# Patient Record
Sex: Male | Born: 1945
Health system: Southern US, Community
[De-identification: ages and names within clinical notes are randomized; demographics above are authoritative.]

## PROBLEM LIST (undated history)

## (undated) DIAGNOSIS — I10 Essential (primary) hypertension: Secondary | ICD-10-CM

## (undated) DIAGNOSIS — E119 Type 2 diabetes mellitus without complications: Secondary | ICD-10-CM

## (undated) DIAGNOSIS — E785 Hyperlipidemia, unspecified: Secondary | ICD-10-CM

## (undated) HISTORY — PX: HERNIA REPAIR: SHX51

---

## 2011-06-11 ENCOUNTER — Encounter (HOSPITAL_BASED_OUTPATIENT_CLINIC_OR_DEPARTMENT_OTHER): Payer: Medicare Other | Attending: General Surgery

## 2011-06-11 DIAGNOSIS — M21969 Unspecified acquired deformity of unspecified lower leg: Secondary | ICD-10-CM | POA: Insufficient documentation

## 2011-06-11 DIAGNOSIS — L97509 Non-pressure chronic ulcer of other part of unspecified foot with unspecified severity: Secondary | ICD-10-CM | POA: Insufficient documentation

## 2011-06-11 DIAGNOSIS — Z79899 Other long term (current) drug therapy: Secondary | ICD-10-CM | POA: Insufficient documentation

## 2011-06-11 DIAGNOSIS — Z7982 Long term (current) use of aspirin: Secondary | ICD-10-CM | POA: Insufficient documentation

## 2011-06-11 DIAGNOSIS — I10 Essential (primary) hypertension: Secondary | ICD-10-CM | POA: Insufficient documentation

## 2011-06-11 DIAGNOSIS — Z8546 Personal history of malignant neoplasm of prostate: Secondary | ICD-10-CM | POA: Insufficient documentation

## 2011-06-11 DIAGNOSIS — E1142 Type 2 diabetes mellitus with diabetic polyneuropathy: Secondary | ICD-10-CM | POA: Insufficient documentation

## 2011-06-11 DIAGNOSIS — E1149 Type 2 diabetes mellitus with other diabetic neurological complication: Secondary | ICD-10-CM | POA: Insufficient documentation

## 2011-06-18 ENCOUNTER — Encounter (HOSPITAL_BASED_OUTPATIENT_CLINIC_OR_DEPARTMENT_OTHER): Payer: Medicare Other | Attending: General Surgery

## 2011-06-18 ENCOUNTER — Other Ambulatory Visit (HOSPITAL_BASED_OUTPATIENT_CLINIC_OR_DEPARTMENT_OTHER): Payer: Self-pay | Admitting: General Surgery

## 2011-06-18 ENCOUNTER — Ambulatory Visit (HOSPITAL_COMMUNITY)
Admission: RE | Admit: 2011-06-18 | Discharge: 2011-06-18 | Disposition: A | Discharge status: Home / Self Care | Payer: Medicare Other | Source: Ambulatory Visit | Attending: General Surgery | Admitting: General Surgery

## 2011-06-18 DIAGNOSIS — L97509 Non-pressure chronic ulcer of other part of unspecified foot with unspecified severity: Secondary | ICD-10-CM | POA: Insufficient documentation

## 2011-06-18 DIAGNOSIS — L02619 Cutaneous abscess of unspecified foot: Secondary | ICD-10-CM | POA: Insufficient documentation

## 2011-06-18 DIAGNOSIS — Z7982 Long term (current) use of aspirin: Secondary | ICD-10-CM | POA: Insufficient documentation

## 2011-06-18 DIAGNOSIS — Z8546 Personal history of malignant neoplasm of prostate: Secondary | ICD-10-CM | POA: Insufficient documentation

## 2011-06-18 DIAGNOSIS — L03039 Cellulitis of unspecified toe: Secondary | ICD-10-CM | POA: Insufficient documentation

## 2011-06-18 DIAGNOSIS — M869 Osteomyelitis, unspecified: Secondary | ICD-10-CM

## 2011-06-18 DIAGNOSIS — Z79899 Other long term (current) drug therapy: Secondary | ICD-10-CM | POA: Insufficient documentation

## 2011-06-18 DIAGNOSIS — E1142 Type 2 diabetes mellitus with diabetic polyneuropathy: Secondary | ICD-10-CM | POA: Insufficient documentation

## 2011-06-18 DIAGNOSIS — E1149 Type 2 diabetes mellitus with other diabetic neurological complication: Secondary | ICD-10-CM | POA: Insufficient documentation

## 2011-06-18 DIAGNOSIS — E119 Type 2 diabetes mellitus without complications: Secondary | ICD-10-CM | POA: Insufficient documentation

## 2011-06-18 DIAGNOSIS — I10 Essential (primary) hypertension: Secondary | ICD-10-CM | POA: Insufficient documentation

## 2011-06-18 DIAGNOSIS — M21969 Unspecified acquired deformity of unspecified lower leg: Secondary | ICD-10-CM | POA: Insufficient documentation

## 2011-06-27 ENCOUNTER — Other Ambulatory Visit: Payer: Medicare Other | Admitting: *Deleted

## 2011-06-27 ENCOUNTER — Ambulatory Visit (INDEPENDENT_AMBULATORY_CARE_PROVIDER_SITE_OTHER): Payer: Medicare Other | Admitting: *Deleted

## 2011-06-27 DIAGNOSIS — L97509 Non-pressure chronic ulcer of other part of unspecified foot with unspecified severity: Secondary | ICD-10-CM

## 2011-07-08 ENCOUNTER — Other Ambulatory Visit: Payer: Self-pay

## 2011-07-09 NOTE — H&P (Signed)
  NAMEKACY, CONELY NO.:  [id number]  MEDICAL RECORD NO.:  [id number]  LOCATION:  FOOT                         FACILITY:  [facility]  PHYSICIAN:  Joanne Gavel, M.D.        DATE OF BIRTH:  April 29, 1946  DATE OF ADMISSION:  06/11/2011 DATE OF DISCHARGE:                             HISTORY & PHYSICAL   CHIEF COMPLAINT:  Wound plantar surface, left great toe.  HISTORY OF PRESENT ILLNESS:  This is a longstanding diabetic patient with neuropathy with an ulceration on the plantar surface of his left great toe present approximately 5 months.  This has been treated locally by the patient.  There has been no pain.  No discharge.  No fever, chills, or sweating spells.  PAST MEDICAL HISTORY:  Significant for diabetes, hypertension, low-grade prostate carcinoma, and left ulnar neuropathy.  PAST SURGICAL HISTORY:  Bilateral carpal tunnel syndrome surgery and right inguinal hernia repair.  SOCIAL HISTORY:  Cigarettes:  None for 30 years.  Alcohol:  None.  MEDICATIONS:  Aspirin, Viagra, Zocor, lisinopril, Januvia, Lantus, and metformin.  ALLERGIES:  None.  REVIEW OF SYSTEMS:  Otherwise essentially negative.  PHYSICAL EXAMINATION:  GENERAL:  Well-developed, well-nourished in no distress. VITAL SIGNS:  Temperature 98.5, pulse 64, respirations 18, blood pressure 169/84. EYES, EARS, NOSE, THROAT:  Normal. NECK:  Supple. CHEST:  Clear.  Normal symmetrical strength. ABDOMEN:  Not examined. EXTREMITIES:  Examination of the extremities reveals peripheral pulses are palpable.  Skin nutrition is generally normal.  The left great toe has a 0.8 x 0.9 ulceration with surrounding callus.  On the right foot, there is deformity with superior facing abnormality and an area of skin discoloration which is right over a palpable bone.  PLAN OF TREATMENT:  Start silver collagen, Darco boot for offloading, consult Orthopedics about the right foot deformity, get vascular studies.  We will  see the patient in 7 days.     Joanne Gavel, M.D.     RA/MEDQ  D:  06/11/2011  T:  06/11/2011  Job:  [id number]  cc:   Thora Lance, M.D.  Electronically Signed by Joanne Gavel M.D. on 07/09/2011 09:17:57 AM

## 2014-05-20 ENCOUNTER — Other Ambulatory Visit: Payer: Self-pay | Admitting: Urology

## 2014-06-03 ENCOUNTER — Encounter (HOSPITAL_COMMUNITY): Payer: Self-pay | Admitting: Pharmacy Technician

## 2014-06-10 ENCOUNTER — Other Ambulatory Visit (HOSPITAL_COMMUNITY): Payer: Self-pay | Admitting: Anesthesiology

## 2014-06-10 NOTE — Patient Instructions (Signed)
[place]  06/10/2014   Your procedure is scheduled on: Friday October 2nd, 2015  Report to [facility] Main Entrance and follow signs to  [place] at  530 AM.  Call this number if you have problems the morning of surgery [phone]   Remember:  Do not eat food or drink liquids :After Midnight.     Take these medicines the morning of surgery with A SIP OF WATER:                                You may not have any metal on your body including hair pins and piercings  Do not wear jewelry, make-up, lotions, powders, or deodorant.   Men may shave face and neck.  Do not bring valuables to the hospital. [place].  Contacts, dentures or bridgework may not be worn into surgery.  Leave suitcase in the car. After surgery it may be brought to your room.  For patients admitted to the hospital, checkout time is 11:00 AM the day of discharge.   Patients discharged the day of surgery will not be allowed to drive home.  Name and phone number of your driver:  Special Instructions: N/A ________________________________________________________________________  [facility] - Preparing for Surgery Before surgery, you can play an important role.  Because skin is not sterile, your skin needs to be as free of germs as possible.  You can reduce the number of germs on your skin by washing with CHG (chlorahexidine gluconate) soap before surgery.  CHG is an antiseptic cleaner which kills germs and bonds with the skin to continue killing germs even after washing. Please DO NOT use if you have an allergy to CHG or antibacterial soaps.  If your skin becomes reddened/irritated stop using the CHG and inform your nurse when you arrive at Short Stay. Do not shave (including legs and underarms) for at least 48 hours prior to the first CHG shower.  You may shave your face/neck. Please follow these instructions carefully:  1.  Shower with CHG Soap the night before  surgery and the  morning of Surgery.  2.  If you choose to wash your hair, wash your hair first as usual with your  normal  shampoo.  3.  After you shampoo, rinse your hair and body thoroughly to remove the  shampoo.                           4.  Use CHG as you would any other liquid soap.  You can apply chg directly  to the skin and wash                       Gently with a scrungie or clean washcloth.  5.  Apply the CHG Soap to your body ONLY FROM THE NECK DOWN.   Do not use on face/ open                           Wound or open sores. Avoid contact with eyes, ears mouth and genitals (private parts).                       Wash face,  Genitals (private parts) with your normal soap.  6.  Wash thoroughly, paying special attention to the area where your surgery  will be performed.  7.  Thoroughly rinse your body with warm water from the neck down.  8.  DO NOT shower/wash with your normal soap after using and rinsing off  the CHG Soap.                9.  Pat yourself dry with a clean towel.            10.  Wear clean pajamas.            11.  Place clean sheets on your bed the night of your first shower and do not  sleep with pets. Day of Surgery : Do not apply any lotions/deodorants the morning of surgery.  Please wear clean clothes to the hospital/surgery center.  FAILURE TO FOLLOW THESE INSTRUCTIONS MAY RESULT IN THE CANCELLATION OF YOUR SURGERY PATIENT SIGNATURE_________________________________  NURSE SIGNATURE__________________________________  ________________________________________________________________________

## 2014-06-13 ENCOUNTER — Encounter (HOSPITAL_COMMUNITY): Payer: Self-pay | Admitting: Pharmacy Technician

## 2014-06-13 ENCOUNTER — Inpatient Hospital Stay (HOSPITAL_COMMUNITY)
Admission: RE | Admit: 2014-06-13 | Discharge: 2014-06-13 | Disposition: A | Discharge status: Home / Self Care | Payer: Medicare HMO | Source: Ambulatory Visit

## 2014-06-14 ENCOUNTER — Encounter (HOSPITAL_COMMUNITY)
Admission: RE | Admit: 2014-06-14 | Discharge: 2014-06-14 | Disposition: A | Discharge status: Home / Self Care | Payer: Medicare HMO | Source: Ambulatory Visit | Attending: Urology | Admitting: Urology

## 2014-06-14 ENCOUNTER — Encounter (HOSPITAL_COMMUNITY): Payer: Self-pay

## 2014-06-14 ENCOUNTER — Ambulatory Visit (HOSPITAL_COMMUNITY)
Admission: RE | Admit: 2014-06-14 | Discharge: 2014-06-14 | Disposition: A | Discharge status: Home / Self Care | Payer: Medicare HMO | Source: Ambulatory Visit | Attending: Anesthesiology | Admitting: Anesthesiology

## 2014-06-14 DIAGNOSIS — Z01818 Encounter for other preprocedural examination: Secondary | ICD-10-CM | POA: Insufficient documentation

## 2014-06-14 DIAGNOSIS — E119 Type 2 diabetes mellitus without complications: Secondary | ICD-10-CM | POA: Diagnosis not present

## 2014-06-14 DIAGNOSIS — I10 Essential (primary) hypertension: Secondary | ICD-10-CM | POA: Insufficient documentation

## 2014-06-14 HISTORY — DX: Type 2 diabetes mellitus without complications: E11.9

## 2014-06-14 HISTORY — DX: Essential (primary) hypertension: I10

## 2014-06-14 LAB — BASIC METABOLIC PANEL
ANION GAP: 12 (ref 5–15)
BUN: 18 mg/dL (ref 6–23)
CO2: 26 meq/L (ref 19–32)
Calcium: 9.7 mg/dL (ref 8.4–10.5)
Chloride: 102 mEq/L (ref 96–112)
Creatinine, Ser: 0.95 mg/dL (ref 0.50–1.35)
GFR calc Af Amer: >90 mL/min (ref >90)
GFR calc non Af Amer: 83 mL/min — ABNORMAL LOW (ref >90)
Glucose, Bld: 109 mg/dL — ABNORMAL HIGH (ref 70–99)
Potassium: 4.4 mEq/L (ref 3.7–5.3)
Sodium: 140 mEq/L (ref 137–147)

## 2014-06-14 LAB — CBC
HCT: 43.7 % (ref 39.0–52.0)
Hemoglobin: 14.6 g/dL (ref 13.0–17.0)
MCH: 27.7 pg (ref 26.0–34.0)
MCHC: 33.4 g/dL (ref 30.0–36.0)
MCV: 82.9 fL (ref 78.0–100.0)
Platelets: 244 10*3/uL (ref 150–400)
RBC: 5.27 MIL/uL (ref 4.22–5.81)
RDW: 13.1 % (ref 11.5–15.5)
WBC: 4.8 10*3/uL (ref 4.0–10.5)

## 2014-06-14 NOTE — Progress Notes (Signed)
Your patient has screened at an elevated risk for Obstructive Sleep Apnea using the Stop-Bang Tool during a pre-surgical vist. A score of 4 or greater is an elevated risk. Score of 5.  

## 2014-06-14 NOTE — Patient Instructions (Signed)
[place]  06/14/2014   Your procedure is scheduled on:      06/17/2014  Report to [facility] Main Entrance and follow signs to  [place] arrive at [place] AM.             Call this number if you have problems the morning of surgery [phone] or Presurgical Testing [phone].   Remember:  Do not eat food or drink liquids :After Midnight.  For Living Will and/or Health Care Power Attorney Forms: please provide copy for your medical record, may bring AM of surgery (forms should be already notarized-we do not provide this service).    Take these medicines the morning of surgery with A SIP OF WATER: NONE                               You may not have any metal on your body including hair pins and piercings  Do not wear jewelry, lotions, powders, or deodorant.  Men may shave face and neck.               Do not bring valuables to the hospital. [place].  Contacts, dentures or bridgework may not be worn into surgery.             Leave over suitcase in car till after surgery.  Discharge time is 11:00am.     ____________________________________________________________________  [facility] - Preparing for Surgery Before surgery, you can play an important role.  Because skin is not sterile, your skin needs to be as free of germs as possible.  You can reduce the number of germs on your skin by washing with CHG (chlorahexidine gluconate) soap before surgery.  CHG is an antiseptic cleaner which kills germs and bonds with the skin to continue killing germs even after washing. Please DO NOT use if you have an allergy to CHG or antibacterial soaps.  If your skin becomes reddened/irritated stop using the CHG and inform your nurse when you arrive at Short Stay. Do not shave (including legs and underarms) for at least 48 hours prior to the first CHG shower.  You may shave your face/neck. Please follow these instructions carefully:  1.  Shower  with CHG Soap the night before surgery and the  morning of Surgery.  2.  If you choose to wash your hair, wash your hair first as usual with your  normal  shampoo.  3.  After you shampoo, rinse your hair and body thoroughly to remove the  shampoo.                           4.  Use CHG as you would any other liquid soap.  You can apply chg directly  to the skin and wash                       Gently with a scrungie or clean washcloth.  5.  Apply the CHG Soap to your body ONLY FROM THE NECK DOWN.   Do not use on face/ open                           Wound or open sores. Avoid contact with eyes, ears mouth and genitals (private parts).  Wash face,  Genitals (private parts) with your normal soap.             6.  Wash thoroughly, paying special attention to the area where your surgery  will be performed.  7.  Thoroughly rinse your body with warm water from the neck down.  8.  DO NOT shower/wash with your normal soap after using and rinsing off  the CHG Soap.                9.  Pat yourself dry with a clean towel.            10.  Wear clean pajamas.            11.  Place clean sheets on your bed the night of your first shower and do not  sleep with pets. Day of Surgery : Do not apply any lotions/deodorants the morning of surgery.  Please wear clean clothes to the hospital/surgery center.  FAILURE TO FOLLOW THESE INSTRUCTIONS MAY RESULT IN THE CANCELLATION OF YOUR SURGERY PATIENT SIGNATURE_________________________________  NURSE SIGNATURE__________________________________  ________________________________________________________________________

## 2014-06-16 ENCOUNTER — Encounter (HOSPITAL_COMMUNITY): Payer: Self-pay | Admitting: Anesthesiology

## 2014-06-16 MED ORDER — DEXTROSE 5 % IV SOLN
530.0000 mg | INTRAVENOUS | Status: DC
  Filled 2014-06-16: qty 13.25

## 2014-06-16 NOTE — Anesthesia Preprocedure Evaluation (Deleted)
Anesthesia Evaluation  Patient identified by MRN, date of birth, ID band Patient awake    Reviewed: Allergy & Precautions, H&P , NPO status , Patient's Chart, lab work & pertinent test results  History of Anesthesia Complications Negative for: history of anesthetic complications  Airway Mallampati: II TM Distance: >3 FB Neck ROM: Full    Dental no notable dental hx.    Pulmonary former smoker,  breath sounds clear to auscultation  Pulmonary exam normal       Cardiovascular Exercise Tolerance: Good hypertension, Pt. on medications Rhythm:Regular Rate:Normal     Neuro/Psych negative neurological ROS  negative psych ROS   GI/Hepatic negative GI ROS, Neg liver ROS,   Endo/Other  diabetes, Type 2, Oral Hypoglycemic Agents  Renal/GU negative Renal ROS  negative genitourinary   Musculoskeletal negative musculoskeletal ROS (+)   Abdominal   Peds negative pediatric ROS (+)  Hematology negative hematology ROS (+)   Anesthesia Other Findings   Reproductive/Obstetrics negative OB ROS                           Anesthesia Physical Anesthesia Plan  ASA: II  Anesthesia Plan: General   Post-op Pain Management:    Induction: Intravenous  Airway Management Planned: LMA and Oral ETT  Additional Equipment:   Intra-op Plan:   Post-operative Plan: Extubation in OR  Informed Consent: I have reviewed the patients History and Physical, chart, labs and discussed the procedure including the risks, benefits and alternatives for the proposed anesthesia with the patient or authorized representative who has indicated his/her understanding and acceptance.   Dental advisory given  Plan Discussed with: CRNA  Anesthesia Plan Comments:         Anesthesia Quick Evaluation

## 2014-06-17 ENCOUNTER — Encounter (HOSPITAL_COMMUNITY): Admission: RE | Payer: Self-pay | Source: Ambulatory Visit

## 2014-06-17 ENCOUNTER — Ambulatory Visit (HOSPITAL_COMMUNITY): Admission: RE | Admit: 2014-06-17 | Payer: Medicare HMO | Source: Ambulatory Visit | Admitting: Urology

## 2014-06-17 ENCOUNTER — Other Ambulatory Visit: Payer: Self-pay | Admitting: Urology

## 2014-06-17 SURGERY — INSERTION, PENILE PROSTHESIS, INFLATABLE
Anesthesia: General

## 2014-06-30 ENCOUNTER — Encounter (HOSPITAL_BASED_OUTPATIENT_CLINIC_OR_DEPARTMENT_OTHER): Payer: Self-pay | Admitting: *Deleted

## 2014-06-30 NOTE — Progress Notes (Signed)
Pt instructed npo p mn 10/18.  To [facility] 10/19 @ 1015.  Needs istat 8 on arrival.  ekg w chart.  Pt may stay OWER.  RCC guidelines reviewed . Pt verbalized his understanding.

## 2014-07-04 ENCOUNTER — Ambulatory Visit (HOSPITAL_BASED_OUTPATIENT_CLINIC_OR_DEPARTMENT_OTHER)
Admission: RE | Admit: 2014-07-04 | Discharge: 2014-07-05 | Disposition: A | Discharge status: Home / Self Care | Payer: Medicare HMO | Source: Ambulatory Visit | Attending: Urology | Admitting: Urology

## 2014-07-04 ENCOUNTER — Encounter (HOSPITAL_BASED_OUTPATIENT_CLINIC_OR_DEPARTMENT_OTHER)
Admission: RE | Disposition: A | Discharge status: Home / Self Care | Payer: Self-pay | Source: Ambulatory Visit | Attending: Urology

## 2014-07-04 ENCOUNTER — Encounter (HOSPITAL_BASED_OUTPATIENT_CLINIC_OR_DEPARTMENT_OTHER): Payer: Self-pay | Admitting: Anesthesiology

## 2014-07-04 ENCOUNTER — Encounter (HOSPITAL_BASED_OUTPATIENT_CLINIC_OR_DEPARTMENT_OTHER): Payer: Medicare HMO | Admitting: Anesthesiology

## 2014-07-04 ENCOUNTER — Ambulatory Visit (HOSPITAL_BASED_OUTPATIENT_CLINIC_OR_DEPARTMENT_OTHER): Payer: Medicare HMO | Admitting: Anesthesiology

## 2014-07-04 DIAGNOSIS — Z7982 Long term (current) use of aspirin: Secondary | ICD-10-CM | POA: Insufficient documentation

## 2014-07-04 DIAGNOSIS — Z87891 Personal history of nicotine dependence: Secondary | ICD-10-CM | POA: Diagnosis not present

## 2014-07-04 DIAGNOSIS — Z79899 Other long term (current) drug therapy: Secondary | ICD-10-CM | POA: Insufficient documentation

## 2014-07-04 DIAGNOSIS — I10 Essential (primary) hypertension: Secondary | ICD-10-CM | POA: Insufficient documentation

## 2014-07-04 DIAGNOSIS — E780 Pure hypercholesterolemia: Secondary | ICD-10-CM | POA: Diagnosis not present

## 2014-07-04 DIAGNOSIS — E118 Type 2 diabetes mellitus with unspecified complications: Secondary | ICD-10-CM | POA: Diagnosis not present

## 2014-07-04 DIAGNOSIS — Z794 Long term (current) use of insulin: Secondary | ICD-10-CM | POA: Diagnosis not present

## 2014-07-04 DIAGNOSIS — Z8546 Personal history of malignant neoplasm of prostate: Secondary | ICD-10-CM | POA: Insufficient documentation

## 2014-07-04 DIAGNOSIS — N5201 Erectile dysfunction due to arterial insufficiency: Secondary | ICD-10-CM

## 2014-07-04 DIAGNOSIS — N529 Male erectile dysfunction, unspecified: Secondary | ICD-10-CM | POA: Diagnosis present

## 2014-07-04 DIAGNOSIS — E119 Type 2 diabetes mellitus without complications: Secondary | ICD-10-CM | POA: Diagnosis not present

## 2014-07-04 HISTORY — PX: PENILE PROSTHESIS IMPLANT: SHX240

## 2014-07-04 HISTORY — DX: Hyperlipidemia, unspecified: E78.5

## 2014-07-04 LAB — POCT I-STAT, CHEM 8
BUN: 17 mg/dL (ref 6–23)
Calcium, Ion: 1.22 mmol/L (ref 1.13–1.30)
Chloride: 105 mEq/L (ref 96–112)
Creatinine, Ser: 0.9 mg/dL (ref 0.50–1.35)
Glucose, Bld: 141 mg/dL — ABNORMAL HIGH (ref 70–99)
HCT: 47 % (ref 39.0–52.0)
Hemoglobin: 16 g/dL (ref 13.0–17.0)
Potassium: 4.1 mEq/L (ref 3.7–5.3)
Sodium: 143 mEq/L (ref 137–147)
TCO2: 27 mmol/L (ref 0–100)

## 2014-07-04 LAB — GLUCOSE, CAPILLARY: Glucose-Capillary: 146 mg/dL — ABNORMAL HIGH (ref 70–99)

## 2014-07-04 SURGERY — INSERTION, PENILE PROSTHESIS, INFLATABLE
Anesthesia: General | Site: Penis

## 2014-07-04 MED ORDER — SUCCINYLCHOLINE CHLORIDE 20 MG/ML IJ SOLN
INTRAMUSCULAR | Status: DC | PRN
  Administered 2014-07-04: 140 mg via INTRAVENOUS

## 2014-07-04 MED ORDER — CHLORHEXIDINE GLUCONATE 4 % EX LIQD
Freq: Once | CUTANEOUS | Status: DC
  Filled 2014-07-04: qty 15

## 2014-07-04 MED ORDER — CEFAZOLIN SODIUM-DEXTROSE 2-3 GM-% IV SOLR
2.0000 g | INTRAVENOUS | Status: AC
  Administered 2014-07-04: 2 g via INTRAVENOUS
  Filled 2014-07-04: qty 50

## 2014-07-04 MED ORDER — FENTANYL CITRATE 0.05 MG/ML IJ SOLN
INTRAMUSCULAR | Status: AC
  Filled 2014-07-04: qty 4

## 2014-07-04 MED ORDER — HYDROMORPHONE HCL 1 MG/ML IJ SOLN
INTRAMUSCULAR | Status: AC
  Filled 2014-07-04: qty 1

## 2014-07-04 MED ORDER — MIDAZOLAM HCL 5 MG/5ML IJ SOLN
INTRAMUSCULAR | Status: DC | PRN
  Administered 2014-07-04: 2 mg via INTRAVENOUS

## 2014-07-04 MED ORDER — ACETAMINOPHEN 10 MG/ML IV SOLN
INTRAVENOUS | Status: DC | PRN
  Administered 2014-07-04: 1000 mg via INTRAVENOUS

## 2014-07-04 MED ORDER — CEFAZOLIN SODIUM-DEXTROSE 2-3 GM-% IV SOLR
2.0000 g | INTRAVENOUS | Status: DC
  Filled 2014-07-04: qty 50

## 2014-07-04 MED ORDER — NEOSTIGMINE METHYLSULFATE 10 MG/10ML IV SOLN
INTRAVENOUS | Status: DC | PRN
  Administered 2014-07-04: 4 mg via INTRAVENOUS

## 2014-07-04 MED ORDER — SODIUM CHLORIDE 0.9 % IJ SOLN: INTRAMUSCULAR | Status: DC | PRN

## 2014-07-04 MED ORDER — OXYCODONE HCL 5 MG PO TABS
ORAL_TABLET | ORAL | Status: AC
  Filled 2014-07-04: qty 1

## 2014-07-04 MED ORDER — ONDANSETRON HCL 4 MG/2ML IJ SOLN
4.0000 mg | INTRAMUSCULAR | Status: DC | PRN
  Filled 2014-07-04: qty 2

## 2014-07-04 MED ORDER — ROCURONIUM BROMIDE 100 MG/10ML IV SOLN
INTRAVENOUS | Status: DC | PRN
  Administered 2014-07-04: 50 mg via INTRAVENOUS

## 2014-07-04 MED ORDER — OXYCODONE HCL 5 MG PO TABS
5.0000 mg | ORAL_TABLET | ORAL | Status: DC | PRN
  Administered 2014-07-04 – 2014-07-05 (×2): 5 mg via ORAL
  Filled 2014-07-04: qty 1

## 2014-07-04 MED ORDER — BACITRACIN-NEOMYCIN-POLYMYXIN OINTMENT TUBE
TOPICAL_OINTMENT | CUTANEOUS | Status: DC | PRN
  Administered 2014-07-04: 1 via TOPICAL

## 2014-07-04 MED ORDER — PROMETHAZINE HCL 25 MG/ML IJ SOLN
6.2500 mg | INTRAMUSCULAR | Status: DC | PRN
  Filled 2014-07-04: qty 1

## 2014-07-04 MED ORDER — ONDANSETRON HCL 4 MG/2ML IJ SOLN
INTRAMUSCULAR | Status: DC | PRN
  Administered 2014-07-04: 4 mg via INTRAVENOUS

## 2014-07-04 MED ORDER — ACETAMINOPHEN 325 MG PO TABS
650.0000 mg | ORAL_TABLET | ORAL | Status: DC | PRN
  Filled 2014-07-04: qty 2

## 2014-07-04 MED ORDER — SODIUM CHLORIDE 0.9 % IR SOLN
Status: DC | PRN
  Administered 2014-07-04: 12:00:00

## 2014-07-04 MED ORDER — LACTATED RINGERS IV SOLN
INTRAVENOUS | Status: DC | PRN
  Administered 2014-07-04 (×2): via INTRAVENOUS

## 2014-07-04 MED ORDER — FENTANYL CITRATE 0.05 MG/ML IJ SOLN
25.0000 ug | INTRAMUSCULAR | Status: DC | PRN
  Filled 2014-07-04: qty 1

## 2014-07-04 MED ORDER — MIDAZOLAM HCL 2 MG/2ML IJ SOLN
INTRAMUSCULAR | Status: AC
  Filled 2014-07-04: qty 2

## 2014-07-04 MED ORDER — CEFAZOLIN SODIUM 1-5 GM-% IV SOLN
INTRAVENOUS | Status: AC
  Filled 2014-07-04: qty 50

## 2014-07-04 MED ORDER — CEFAZOLIN SODIUM-DEXTROSE 2-3 GM-% IV SOLR
INTRAVENOUS | Status: AC
  Filled 2014-07-04: qty 50

## 2014-07-04 MED ORDER — EPHEDRINE SULFATE 50 MG/ML IJ SOLN
INTRAMUSCULAR | Status: DC | PRN
  Administered 2014-07-04 (×2): 10 mg via INTRAVENOUS

## 2014-07-04 MED ORDER — HYDROMORPHONE HCL 1 MG/ML IJ SOLN
0.5000 mg | INTRAMUSCULAR | Status: DC | PRN
  Administered 2014-07-04 (×2): 1 mg via INTRAVENOUS
  Filled 2014-07-04: qty 1

## 2014-07-04 MED ORDER — GLYCOPYRROLATE 0.2 MG/ML IJ SOLN
INTRAMUSCULAR | Status: DC | PRN
  Administered 2014-07-04: .6 mg via INTRAVENOUS

## 2014-07-04 MED ORDER — GENTAMICIN SULFATE 40 MG/ML IJ SOLN
5.0000 mg/kg | INTRAVENOUS | Status: AC
  Administered 2014-07-04: 680 mg via INTRAVENOUS
  Filled 2014-07-04 (×2): qty 17

## 2014-07-04 MED ORDER — FENTANYL CITRATE 0.05 MG/ML IJ SOLN
INTRAMUSCULAR | Status: DC | PRN
  Administered 2014-07-04 (×2): 50 ug via INTRAVENOUS

## 2014-07-04 MED ORDER — PROPOFOL INFUSION 10 MG/ML OPTIME
INTRAVENOUS | Status: DC | PRN
  Administered 2014-07-04: 250 mL via INTRAVENOUS

## 2014-07-04 MED ORDER — LIDOCAINE HCL (CARDIAC) 20 MG/ML IV SOLN
INTRAVENOUS | Status: DC | PRN
  Administered 2014-07-04: 100 mg via INTRAVENOUS

## 2014-07-04 MED ORDER — LACTATED RINGERS IV SOLN
INTRAVENOUS | Status: DC
  Administered 2014-07-04: 11:00:00 via INTRAVENOUS
  Filled 2014-07-04: qty 1000

## 2014-07-04 MED ORDER — SODIUM CHLORIDE 0.9 % IV SOLN
INTRAVENOUS | Status: DC
  Administered 2014-07-05: 04:00:00 via INTRAVENOUS
  Filled 2014-07-04: qty 1000

## 2014-07-04 MED ORDER — CEFAZOLIN SODIUM-DEXTROSE 2-3 GM-% IV SOLR
2.0000 g | Freq: Three times a day (TID) | INTRAVENOUS | Status: DC
  Administered 2014-07-04 – 2014-07-05 (×2): 2 g via INTRAVENOUS
  Filled 2014-07-04: qty 50

## 2014-07-04 MED ORDER — BUPIVACAINE HCL 0.5 % IJ SOLN
INTRAMUSCULAR | Status: DC | PRN
  Administered 2014-07-04: 10 mL

## 2014-07-04 SURGICAL SUPPLY — 71 items
APPLICATOR COTTON TIP 6IN STRL (MISCELLANEOUS) IMPLANT
BAG DECANTER FOR FLEXI CONT (MISCELLANEOUS) ×2 IMPLANT
BAG URINE DRAINAGE (UROLOGICAL SUPPLIES) ×2 IMPLANT
BANDAGE CO FLEX L/F 2IN X 5YD (GAUZE/BANDAGES/DRESSINGS) IMPLANT
BENZOIN TINCTURE PRP APPL 2/3 (GAUZE/BANDAGES/DRESSINGS) ×2 IMPLANT
BLADE 15 SAFETY STRL DISP (BLADE) IMPLANT
BLADE CLIPPER SURG (BLADE) IMPLANT
BLADE HEX COATED 2.75 (ELECTRODE) ×2 IMPLANT
BLADE SURG 10 STRL SS (BLADE) IMPLANT
BLADE SURG 15 STRL LF DISP TIS (BLADE) ×2 IMPLANT
BLADE SURG 15 STRL SS (BLADE) ×2
BNDG GAUZE ELAST 4 BULKY (GAUZE/BANDAGES/DRESSINGS) ×2 IMPLANT
CANISTER SUCTION 1200CC (MISCELLANEOUS) IMPLANT
CANISTER SUCTION 2500CC (MISCELLANEOUS) ×2 IMPLANT
CATH FOLEY 2WAY SLVR  5CC 16FR (CATHETERS) ×1
CATH FOLEY 2WAY SLVR 5CC 16FR (CATHETERS) ×1 IMPLANT
CHLORAPREP W/TINT 26ML (MISCELLANEOUS) ×2 IMPLANT
CLOTH BEACON ORANGE TIMEOUT ST (SAFETY) ×2 IMPLANT
COVER MAYO STAND STRL (DRAPES) ×4 IMPLANT
COVER TABLE BACK 60X90 (DRAPES) ×2 IMPLANT
DERMABOND ADVANCED (GAUZE/BANDAGES/DRESSINGS) ×1
DERMABOND ADVANCED .7 DNX12 (GAUZE/BANDAGES/DRESSINGS) ×1 IMPLANT
DISSECTOR ROUND CHERRY 3/8 STR (MISCELLANEOUS) IMPLANT
DRAPE EXTREMITY T 121X128X90 (DRAPE) ×2 IMPLANT
DRAPE LAPAROTOMY TRNSV 102X78 (DRAPE) IMPLANT
DRSG TEGADERM 4X4.75 (GAUZE/BANDAGES/DRESSINGS) ×2 IMPLANT
ELECT REM PT RETURN 9FT ADLT (ELECTROSURGICAL) ×2
ELECTRODE REM PT RTRN 9FT ADLT (ELECTROSURGICAL) ×1 IMPLANT
GLOVE BIO SURGEON STRL SZ8 (GLOVE) ×2 IMPLANT
GOWN PREVENTION PLUS LG XLONG (DISPOSABLE) IMPLANT
GOWN STRL REIN XL XLG (GOWN DISPOSABLE) IMPLANT
HOLDER FOLEY CATH W/STRAP (MISCELLANEOUS) ×2 IMPLANT
IV NS 250ML (IV SOLUTION) ×1
IV NS 250ML BAXH (IV SOLUTION) ×1 IMPLANT
KIT TITAN ASSEMBLY (Erectile Restoration) ×1 IMPLANT
KIT TITAN ASSEMBLY STANDARD (Erectile Restoration) ×1 IMPLANT
KIT TITAN ASSEMBLY STD (Erectile Restoration) ×1 IMPLANT
NEEDLE HYPO 25X1 1.5 SAFETY (NEEDLE) ×2 IMPLANT
NS IRRIG 500ML POUR BTL (IV SOLUTION) ×2 IMPLANT
PACK BASIN DAY SURGERY FS (CUSTOM PROCEDURE TRAY) ×2 IMPLANT
PENCIL BUTTON HOLSTER BLD 10FT (ELECTRODE) ×2 IMPLANT
PLUG CATH AND CAP STER (CATHETERS) ×2 IMPLANT
PROS TITAN SCROT 0 ANG 20CM (Erectile Restoration) ×2 IMPLANT
PROSTHESIS TTN SCRO 0 ANG 20CM (Erectile Restoration) ×1 IMPLANT
RESERVOIR TITAN 12.5CC W/VLV ×2 IMPLANT
RETRACTOR WILSON SYSTEM (INSTRUMENTS) ×2 IMPLANT
SPONGE GAUZE 4X4 12PLY STER LF (GAUZE/BANDAGES/DRESSINGS) ×2 IMPLANT
SPONGE LAP 18X18 X RAY DECT (DISPOSABLE) IMPLANT
SPONGE LAP 4X18 X RAY DECT (DISPOSABLE) ×6 IMPLANT
STRIP CLOSURE SKIN 1/2X4 (GAUZE/BANDAGES/DRESSINGS) IMPLANT
SUPPORT SCROTAL LG STRP (MISCELLANEOUS) ×2 IMPLANT
SURGILUBE 2OZ TUBE FLIPTOP (MISCELLANEOUS) ×2 IMPLANT
SUT CHROMIC 3 0 SH 27 (SUTURE) ×6 IMPLANT
SUT MNCRL AB 4-0 PS2 18 (SUTURE) IMPLANT
SUT PDS AB 2-0 CT2 27 (SUTURE) ×4 IMPLANT
SUT VIC AB 2-0 UR6 27 (SUTURE) ×4 IMPLANT
SUT VIC AB 3-0 SH 27 (SUTURE)
SUT VIC AB 3-0 SH 27X BRD (SUTURE) IMPLANT
SUT VICRYL 0 UR6 27IN ABS (SUTURE) IMPLANT
SUT VICRYL 4-0 PS2 18IN ABS (SUTURE) IMPLANT
SYR 20CC LL (SYRINGE) ×2 IMPLANT
SYR 50ML LL SCALE MARK (SYRINGE) ×4 IMPLANT
SYR BULB IRRIGATION 50ML (SYRINGE) ×2 IMPLANT
SYR CONTROL 10ML LL (SYRINGE) ×2 IMPLANT
SYRINGE 10CC LL (SYRINGE) ×2 IMPLANT
TOWEL OR 17X24 6PK STRL BLUE (TOWEL DISPOSABLE) ×4 IMPLANT
TRAY DSU PREP LF (CUSTOM PROCEDURE TRAY) ×2 IMPLANT
TUBE CONNECTING 12X1/4 (SUCTIONS) ×2 IMPLANT
WATER STERILE IRR 3000ML UROMA (IV SOLUTION) IMPLANT
WATER STERILE IRR 500ML POUR (IV SOLUTION) ×2 IMPLANT
YANKAUER SUCT BULB TIP NO VENT (SUCTIONS) ×2 IMPLANT

## 2014-07-04 NOTE — Discharge Instructions (Signed)
Penile prosthesis postoperative instructions ° °Wound: ° °In most cases your incision will have absorbable sutures that will dissolve within the first 10-20 days. Some will fall out even earlier. Expect some redness as the sutures dissolved but this should occur only around the sutures. If there is generalized redness, especially with increasing pain or swelling, let us know. The scrotum and penis will very likely get "black and blue" as the blood in the tissues spread. Sometimes the whole scrotum will turn colors. The black and blue is followed by a yellow and brown color. In time, all the discoloration will go away. In some cases some firm swelling in the area of the testicle and pump may persist for up to 4-6 weeks after the surgery and is considered normal in most cases. ° °Diet: ° °You may return to your normal diet within 24 hours following your surgery. You may note some mild nausea and possibly vomiting the first 6-8 hours following surgery. This is usually due to the side effects of anesthesia, and will disappear quite soon. I would suggest clear liquids and a very light meal the first evening following your surgery. ° °Activity: ° °Your physical activity should be restricted the first 48 hours. During that time you should remain relatively inactive, moving about only when necessary. During the first 7-10 days following surgery he should avoid lifting any heavy objects (anything greater than 15 pounds), and avoid strenuous exercise. If you work, ask us specifically about your restrictions, both for work and home. We will write a note to your employer if needed. ° °You should plan to wear a tight pair of jockey shorts or an athletic supporter for the first 4-5 days, even to sleep. This will keep the scrotum immobilized to some degree and keep the swelling down.The position of your penis will determine what is most comfortable but I strongly urge you to keep the penis in the "up" position (toward your  head). ° °Ice packs should be placed on and off over the scrotum for the first 48 hours. Frozen peas or corn in a ZipLock bag can be frozen, used and re-frozen. Fifteen minutes on and 15 minutes off is a reasonable schedule. The ice is a good pain reliever and keeps the swelling down. ° °Hygiene: ° °You may shower 48 hours after your surgery. Tub bathing should be restricted until the seventh day. ° °Medication: ° °You will be sent home with some type of pain medication. In many cases you will be sent home with a narcotic pain pill (hydrocodone or oxycodone). If the pain is not too bad, you may take either Tylenol (acetaminophen) or Advil (ibuprofen) which contain no narcotic agents, and might be tolerated a little better, with fewer side effects. If the pain medication you are sent home with does not control the pain, you will have to let us know. Some narcotic pain medications cannot be given or refilled by a phone call to a pharmacy. ° °Problems you should report to us: ° °· Fever of 101.0 degrees Fahrenheit or greater. °· Moderate or severe swelling under the skin incision or involving the scrotum. °Drug reaction such as hives, a rash, nausea or vomiting. °

## 2014-07-04 NOTE — Transfer of Care (Signed)
Immediate Anesthesia Transfer of Care Note  Patient: Martin Gordon  Procedure(s) Performed: Procedure(s): PENILE PROTHESIS INFLATABLE, Three piece (N/A)  Patient Location: PACU  Anesthesia Type:General  Level of Consciousness: awake, alert , oriented and patient cooperative  Airway & Oxygen Therapy: Patient Spontanous Breathing and Patient connected to nasal cannula oxygen  Post-op Assessment: Report given to PACU RN and Post -op Vital signs reviewed and stable  Post vital signs: Reviewed and stable  Complications: No apparent anesthesia complications

## 2014-07-04 NOTE — Anesthesia Preprocedure Evaluation (Addendum)
Anesthesia Evaluation  Patient identified by MRN, date of birth, ID band Patient awake    Reviewed: Allergy & Precautions, H&P , NPO status , Patient's Chart, lab work & pertinent test results  Airway Mallampati: II TM Distance: >3 FB Neck ROM: Full    Dental  (+) Partial Upper, Partial Lower, Dental Advisory Given   Pulmonary former smoker,  breath sounds clear to auscultation  Pulmonary exam normal       Cardiovascular Exercise Tolerance: Good hypertension, Pt. on medications Rhythm:Regular Rate:Normal  CXR: No active disease. ECG: NSR, nonspecific TWA   Neuro/Psych negative neurological ROS  negative psych ROS   GI/Hepatic negative GI ROS, Neg liver ROS,   Endo/Other  diabetes, Type 2, Oral Hypoglycemic Agents  Renal/GU negative Renal ROS  negative genitourinary   Musculoskeletal negative musculoskeletal ROS (+)   Abdominal (+) + obese,   Peds negative pediatric ROS (+)  Hematology negative hematology ROS (+)   Anesthesia Other Findings   Reproductive/Obstetrics negative OB ROS                      Anesthesia Physical Anesthesia Plan  ASA: II  Anesthesia Plan: General   Post-op Pain Management:    Induction: Intravenous  Airway Management Planned: LMA  Additional Equipment:   Intra-op Plan:   Post-operative Plan: Extubation in OR  Informed Consent: I have reviewed the patients History and Physical, chart, labs and discussed the procedure including the risks, benefits and alternatives for the proposed anesthesia with the patient or authorized representative who has indicated his/her understanding and acceptance.   Dental advisory given  Plan Discussed with: CRNA  Anesthesia Plan Comments:         Anesthesia Quick Evaluation

## 2014-07-04 NOTE — Anesthesia Procedure Notes (Addendum)
Procedure Name: LMA Insertion Date/Time: 07/04/2014 11:43 AM Performed by: Wanita Chamberlain Pre-anesthesia Checklist: Patient identified, Timeout performed, Emergency Drugs available, Suction available and Patient being monitored Patient Re-evaluated:Patient Re-evaluated prior to inductionOxygen Delivery Method: Circle system utilized Preoxygenation: Pre-oxygenation with 100% oxygen Intubation Type: IV induction Ventilation: Mask ventilation without difficulty LMA: LMA inserted LMA Size: 5.0 Number of attempts: 1 Airway Equipment and Method: Bite block Placement Confirmation: positive ETCO2 Tube secured with: Tape Dental Injury: Teeth and Oropharynx as per pre-operative assessment    Procedure Name: Intubation Date/Time: 07/04/2014 12:07 PM Performed by: Wanita Chamberlain Pre-anesthesia Checklist: Emergency Drugs available, Suction available and Patient being monitored Patient Re-evaluated:Patient Re-evaluated prior to inductionOxygen Delivery Method: Circle system utilized Intubation Type: IV induction Ventilation: Mask ventilation without difficulty Laryngoscope Size: Mac and 4 Grade View: Grade II Tube type: Oral Tube size: 8.0 mm Number of attempts: 1 Airway Equipment and Method: Stylet Placement Confirmation: ETT inserted through vocal cords under direct vision,  positive ETCO2 and breath sounds checked- equal and bilateral Secured at: 23 cm Tube secured with: Tape Dental Injury: Teeth and Oropharynx as per pre-operative assessment

## 2014-07-04 NOTE — H&P (Signed)
Mr. House is a 68 year old male with prostate cancer on active surveillance who also has ED.   History of Present Illness Adenocarcinoma of the prostate: His PSA in 3/10 was 4.66 and when checked in 3/11 had risen to 6.77. He was asymptomatic with a benign DRE. He therefore underwent TRUS/Bx on 01/22/10 which revealed Gleason grade 3+3 = 6 involving 5% of 2 cores from the apex. His prostate measured 99cc.   EH:OZYYQM surveillance.  Repeat TRUS/BX 11/30/13: He was found have a prostate volume of 175 cc.  Pathology: 1 core positive for atypia only.       Organic erectile dysfunction: This has been present for some time and has been managed with Viagra. This had not been working as well as it use to for him and he wanted to know if there was an alternative in the past but that does not currently seem to be an issue for him I will. He does have diabetes, hypertension and hypercholesterolemia as risk factors. He noted Viagra 100 mg lost its effectiveness so I had him try Levitra 20 mg. which also was not effective.     Interval history: I saw him last I prescribed MUSE but he never got the prescription filled because it was too expensive. He wanted to discuss other options to manage his erectile dysfunction.      Past Medical History Problems  1. History of Elevated prostate specific antigen (PSA) (790.93) 2. History of diabetes mellitus (V12.29) 3. History of hypercholesterolemia (V12.29) 4. History of hypertension (V12.59)  Surgical History Problems  1. History of Biopsy Of The Prostate Needle 2. History of Biopsy Of The Prostate Needle 3. History of Inguinal Hernia Repair  Current Meds 1. Aspirin 81 MG Oral Tablet;  Therapy: (Recorded:19Apr2011) to Recorded 2. HumaLOG KwikPen 100 UNIT/ML Subcutaneous Solution Pen-injector;  Therapy: [id number] to Recorded 3. Lantus SoloStar 100 UNIT/ML Subcutaneous Solution Pen-injector;  Therapy: [id number] to Recorded 4.  Lisinopril-Hydrochlorothiazide 20-25 MG Oral Tablet;  Therapy: [id number] to Recorded 5. MetFORMIN HCl - 1000 MG Oral Tablet;  Therapy: 25Sep2013 to Recorded 6. Muse 1000 MCG Urethral Pellet; USE AS DIRECTED;  Therapy: [phone] to (Last Rx:21Jul2015) Ordered 7. Simvastatin 40 MG Oral Tablet;  Therapy: (Recorded:28Jan2013) to Recorded  Allergies Medication  1. No Known Drug Allergies  Family History Problems  1. Family history of Family Health Status Number Of Children   3 children 2. No pertinent family history : Mother  Social History Problems  1. Caffeine Use 2. Former smoker (V15.82) 3. Marital History - Single  Review of Systems Genitourinary and gastrointestinal system(s) were reviewed and pertinent findings if present are noted.  Genitourinary: nocturia, weak urinary stream and erectile dysfunction.  Constitutional: no recent weight loss.  Musculoskeletal: no bone pain.  The remainder of his 13 point review of systems was negative.  Vitals Blood Pressure: 156 / 81 Temperature: 97.5 F Heart Rate: 69  Physical Exam Constitutional: Well nourished and well developed. No acute distress.  ENT:. The ears and nose are normal in appearance.  Neck: The appearance of the neck is normal and no neck mass is present.  Pulmonary: No respiratory distress and normal respiratory rhythm and effort.  Cardiovascular: Heart rate and rhythm are normal. No peripheral edema.  Abdomen: The abdomen is soft and nontender. No masses are palpated. No CVA tenderness. No hernias are palpable. No hepatosplenomegaly noted.  Rectal: Rectal exam demonstrates normal sphincter tone, no tenderness and no masses. Estimated prostate size is 3+. The prostate  has no nodularity and is not tender. The left seminal vesicle is nonpalpable. The right seminal vesicle is nonpalpable. The perineum is normal on inspection.  Genitourinary: Examination of the penis demonstrates no discharge, no masses, no lesions and  a normal meatus. The penis is uncircumcised. The scrotum is without lesions. The right epididymis is palpably normal and non-tender. The left epididymis is palpably normal and non-tender. The right testis is non-tender and without masses. The left testis is non-tender and without masses.  Lymphatics: The femoral and inguinal nodes are not enlarged or tender.  Skin: Normal skin turgor, no visible rash and no visible skin lesions.  Neuro/Psych:. Mood and affect are appropriate.   Assessment  I had a long discussion with him about penile prosthesis surgery. We discussed injection therapy but he does not want to consider that. I therefore showed him a sample of the device made by [employer] and also gave him written information and a DVD from that company which she reviewed. We discussed the incision that is used for placement of the prosthesis as well as all of the risks and complications associated with this surgery. He does understand that if the device becomes infected it will need to be removed in many cases. I discussed sizing of the device and showed him how the device worked. We went over the incision used, the need for an overnight stay in the hospital and the anticipated postoperative course. He understands and has had all of his questions answered to his satisfaction and elects to proceed with penile implant surgery.  Plan He is scheduled for a 3 piece inflatable penile prosthesis implantation.

## 2014-07-04 NOTE — Op Note (Signed)
PATIENT:  Martin Gordon  PRE-OPERATIVE DIAGNOSIS:  Organic erectile dysfunction  POST-OPERATIVE DIAGNOSIS:  Same  PROCEDURE:  Procedure(s): 3 piece inflatable in all prosthesis (Coloplast)  SURGEON:  Claybon Jabs  INDICATION: He has had long-standing organic erectile dysfunction and has been treated In the past with phosphodiesterase inhibitors.  These lost their effectiveness and he was prescribed intraurethral suppositories but was unable to afford these.  We discussed the treatment options moving forward and he elected to proceed with penile prosthesis placement.  ANESTHESIA:  General  EBL:  Minimal  Device: 3 piece Titan: 125 cc reservoir, 20 cm cylinders and 3 + 1 cm rear-tip extenders on right and left sides  LOCAL MEDICATIONS USED:  None  SPECIMEN: None  DISPOSITION OF SPECIMEN:  N/A  Description of procedure: The patient was taken to the major operating room, placed on the table and administered general anesthesia in the supine position. His genitalia was then scrubbed for 10 minutes, painted and then sterilely draped. An official timeout was then performed.  A 16 French Foley catheter was placed in the bladder and the bladder was drained and the catheter was plugged. A midline median raphae scrotal incision was then made and then blunt dissection was used to expose the corpus cavernosum on the right and left sides. This was further cleared of overlying tissue using sharp technique. 2-0 PDS sutures were then placed in the corpus cavernosum to service stay sutures and then an incision was then made in the corpus cavernosum first on the left-hand side with the knife and then extended proximally and distally with the curved Mayo scissors. I then dilated the corpus cavernosum with the Hegar dilators starting at 10 and progressing to 14 proximally and distally. I then irrigated the corpus cavernosum with antibiotic solution and measured the distance proximally and distally from the stay  suture and was found to be 12 cm. in both directions. I then turned my attention to the contralateral corpus cavernosum and placed my stay sutures, made my corporotomy and dilated the corpus cavernosum in an identical fashion. This was measured and also was found to be 12 cm proximally and distally. It was irrigated with antibiotic solution as was the scrotum. I then chose an 20 cm cylinder set with A 3 cm and a 1 cm rear-tip extenders and these were prepped while I prepared the site for reservoir placement.  I used blunt technique to dissect up to the external inguinal ring on the  left-hand side. He had undergone a previous right inguinal herniorrhaphy in the distant past. I swept the spermatic cord laterally and then was able to bluntly dissect with my index finger through the floor of the inguinal ring. I drained the bladder completely with a Foley catheter and then developed a space behind the symphysis pubis for the reservoir. I irrigated the space with antibiotic solution and then placed the reservoir in this location. I then filled the reservoir with 110 cc of sterile saline.  Attention was redirected to the corporotomies where the cylinders were then placed by first fixing the suture to the distal aspect of the right cylinder to a straight needle. This was then loaded on the [facility] inserter and passed through the corporotomy and distally. I then advanced the straight needle with the Furlow inserter out through the glans and this was grasped with a hemostat and pulled through the glans and the suture was secured with a hemostat. I then performed an identical maneuver on the contralateral side. After  this was performed I irrigated both corpus cavernosum and inserted the distal portion of the cylinder through the corporotomies and pulled this to the end of the corpora with the suture. The proximal aspect with the rear-tip extender was then passed through the corporotomy and into the seated position on each  side. I then connected reservoir tubing to a syringe filled with sterile saline and inflated the device. I noted a good straight erection with both cylinders equidistant under the glans and no buckling of the cylinders. I therefore deflated the device and closed the corporotomies with running 2-0 PDS suture. The cylinder was then connected to the pump after excising the excess tubing with appropriate shodded hemostats in place and then I used the supplied connectors to make the connection. I then again cycled the device with the pump and it cycled properly. I deflated the device and pumped it up about three quarters of the way to aid with hemostasis. I irrigated the scrotum once again with antibiotic solution.  I then developed the scrotal pocket in the right hemiscrotum in place the pump in this location. I irrigated the wound one last time with antibiotic irrigation and then closed the deep scrotal tissue over the tubing and pump with running 3-0 chromic suture. A second layer was then closed over this first layer with running 3-0 chromic and then the skin was closed with running 3-0 chromic. Antibiotic ointment, a 4 x 4 guaz were then applied to the scrotum. I then wrapped the scrotum and penis with a Curlex. The catheter was connected to closed system drainage and the patient was awakened and taken recovery room in stable and satisfactory condition. He tolerated the procedure well and there were no intraoperative complications. Needle sponge and instrument counts were correct at the end of the operation.  PLAN OF CARE: He will be observed overnight with intravenous antibiotics and pain medication with plan for discharge in the a.m.   PATIENT DISPOSITION:  PACU - hemodynamically stable.

## 2014-07-05 ENCOUNTER — Encounter (HOSPITAL_BASED_OUTPATIENT_CLINIC_OR_DEPARTMENT_OTHER): Payer: Self-pay | Admitting: Urology

## 2014-07-05 DIAGNOSIS — N529 Male erectile dysfunction, unspecified: Secondary | ICD-10-CM | POA: Diagnosis not present

## 2014-07-05 LAB — GLUCOSE, CAPILLARY: Glucose-Capillary: 97 mg/dL (ref 70–99)

## 2014-07-05 MED ORDER — OXYCODONE-ACETAMINOPHEN 5-325 MG PO TABS
ORAL_TABLET | ORAL | Status: AC
  Filled 2014-07-05: qty 1

## 2014-07-05 MED ORDER — SODIUM CHLORIDE 0.9 % IR SOLN
Status: DC | PRN
  Administered 2014-07-04: 1000 mL

## 2014-07-05 MED ORDER — CEFAZOLIN SODIUM-DEXTROSE 2-3 GM-% IV SOLR
INTRAVENOUS | Status: AC
  Filled 2014-07-05: qty 50

## 2014-07-05 MED ORDER — CIPROFLOXACIN HCL 500 MG PO TABS
500.0000 mg | ORAL_TABLET | Freq: Two times a day (BID) | ORAL | Status: DC
Start: 2014-07-05 — End: 2016-01-30

## 2014-07-05 MED ORDER — OXYCODONE HCL 5 MG PO TABS
ORAL_TABLET | ORAL | Status: AC
  Filled 2014-07-05: qty 1

## 2014-07-05 MED ORDER — OXYCODONE-ACETAMINOPHEN 5-325 MG PO TABS
2.0000 | ORAL_TABLET | ORAL | Status: DC | PRN
  Administered 2014-07-05: 2 via ORAL
  Filled 2014-07-05: qty 2

## 2014-07-05 MED ORDER — OXYCODONE-ACETAMINOPHEN 10-325 MG PO TABS: 1.0000 | ORAL_TABLET | ORAL | Status: DC | PRN

## 2014-07-05 NOTE — Anesthesia Postprocedure Evaluation (Signed)
  Anesthesia Post-op Note  Patient: Martin Gordon  Procedure(s) Performed: Procedure(s) (LRB): PENILE PROTHESIS INFLATABLE, Three piece (N/A)  Patient Location: PACU  Anesthesia Type: General  Level of Consciousness: awake and alert   Airway and Oxygen Therapy: Patient Spontanous Breathing  Post-op Pain: mild  Post-op Assessment: Post-op Vital signs reviewed, Patient's Cardiovascular Status Stable, Respiratory Function Stable, Patent Airway and No signs of Nausea or vomiting  Last Vitals:  Filed Vitals:   07/05/14 0543  BP: 144/64  Pulse: 59  Temp: 36.7 C  Resp: 18    Post-op Vital Signs: stable   Complications: No apparent anesthesia complications

## 2014-07-05 NOTE — Progress Notes (Signed)
Report given by marian rn from night shift.  Dr.Otellin had already been in to discharge patient to go home.  Patients brother is coming to take patient home once he voids.

## 2014-07-05 NOTE — Discharge Summary (Signed)
Physician Discharge Summary  Patient ID: Martin Gordon MRN: [id number] DOB/AGE: 1945/10/02 68 y.o.  Admit date: 07/04/2014 Discharge date: 07/05/2014  Admission Diagnoses:  Erectile dysfunction secondary to arterial insufficiency  Discharge Diagnoses:  Active Problems:   Erectile dysfunction   Discharged Condition: Good  Hospital Course: He underwent an elective inflatable penile prosthesis implantation without complication. He was maintained overnight for pain control and intravenous antibiotics. He has done well throughout the night with minimal discomfort. His known nominal discomfort and no real complaint. He is felt ready for discharge.   Discharge Exam: Blood pressure 144/64, pulse 59, temperature 98 F (36.7 C), temperature source Oral, resp. rate 18, height 6\' 4"  (1.93 m), weight 136.533 kg (301 lb), SpO2 98.00%. His dressing is dry and intact. His foreskin is in the normal anatomic position and there is no evidence of significant ecchymoses or hematoma.   Disposition: He'll be discharged home after his Foley catheter is removed this morning. I will maintain him on Cipro for one week postoperatively and he will return for a postoperative check in one week.  Discharge Instructions   Discharge patient    Complete by:  As directed      Foley catheter - discontinue    Complete by:  As directed             Medication List         aspirin EC 81 MG tablet  Take 81 mg by mouth every morning.     ciprofloxacin 500 MG tablet  Commonly known as:  CIPRO  Take 1 tablet (500 mg total) by mouth 2 (two) times daily.     FISH OIL PO  Take 2 tablets by mouth every morning.     lisinopril 20 MG tablet  Commonly known as:  PRINIVIL,ZESTRIL  Take 20 mg by mouth every morning.     metFORMIN 1000 MG tablet  Commonly known as:  GLUCOPHAGE  Take 1,000 mg by mouth 2 (two) times daily with a meal.     multivitamin with minerals Tabs tablet  Take 1 tablet by mouth every morning.     oxyCODONE-acetaminophen 10-325 MG per tablet  Commonly known as:  PERCOCET  Take 1-2 tablets by mouth every 4 (four) hours as needed for pain. Maximum dose per 24 hours - 8 pills     simvastatin 40 MG tablet  Commonly known as:  ZOCOR  Take 40 mg by mouth at bedtime.           Follow-up Information   Follow up with Claybon Jabs, MD On 07/12/2014. (at 9:45)    Specialty:  Urology   Contact information:   [place] [address] [phone]       Signed: Claybon Jabs 07/05/2014, 6:40 AM

## 2014-07-05 NOTE — Progress Notes (Signed)
Foley catheter removed per Dr. Simone Curia order.  Pt tolerated procedure well.  Minimal old blood noted on surgical dsg around penis.  Pt up w/ standby asst to BR- passed gas but no BM.  Pt stated pain was a 7- Oxy IR 5mg  given

## 2014-09-27 DIAGNOSIS — I10 Essential (primary) hypertension: Secondary | ICD-10-CM | POA: Diagnosis not present

## 2014-09-27 DIAGNOSIS — E119 Type 2 diabetes mellitus without complications: Secondary | ICD-10-CM | POA: Diagnosis not present

## 2014-10-26 DIAGNOSIS — R0683 Snoring: Secondary | ICD-10-CM | POA: Diagnosis not present

## 2015-03-09 DIAGNOSIS — Z1389 Encounter for screening for other disorder: Secondary | ICD-10-CM | POA: Diagnosis not present

## 2015-03-09 DIAGNOSIS — Z0000 Encounter for general adult medical examination without abnormal findings: Secondary | ICD-10-CM | POA: Diagnosis not present

## 2015-03-09 DIAGNOSIS — Z794 Long term (current) use of insulin: Secondary | ICD-10-CM | POA: Diagnosis not present

## 2015-03-09 DIAGNOSIS — E780 Pure hypercholesterolemia: Secondary | ICD-10-CM | POA: Diagnosis not present

## 2015-03-09 DIAGNOSIS — E1142 Type 2 diabetes mellitus with diabetic polyneuropathy: Secondary | ICD-10-CM | POA: Diagnosis not present

## 2015-03-09 DIAGNOSIS — I10 Essential (primary) hypertension: Secondary | ICD-10-CM | POA: Diagnosis not present

## 2015-03-09 DIAGNOSIS — E11321 Type 2 diabetes mellitus with mild nonproliferative diabetic retinopathy with macular edema: Secondary | ICD-10-CM | POA: Diagnosis not present

## 2015-05-18 DIAGNOSIS — C61 Malignant neoplasm of prostate: Secondary | ICD-10-CM | POA: Diagnosis not present

## 2015-05-18 DIAGNOSIS — N400 Enlarged prostate without lower urinary tract symptoms: Secondary | ICD-10-CM | POA: Diagnosis not present

## 2015-07-14 DIAGNOSIS — Z6838 Body mass index (BMI) 38.0-38.9, adult: Secondary | ICD-10-CM | POA: Diagnosis not present

## 2015-07-14 DIAGNOSIS — E1142 Type 2 diabetes mellitus with diabetic polyneuropathy: Secondary | ICD-10-CM | POA: Diagnosis not present

## 2015-07-14 DIAGNOSIS — Z23 Encounter for immunization: Secondary | ICD-10-CM | POA: Diagnosis not present

## 2015-07-14 DIAGNOSIS — E11621 Type 2 diabetes mellitus with foot ulcer: Secondary | ICD-10-CM | POA: Diagnosis not present

## 2015-07-14 DIAGNOSIS — E669 Obesity, unspecified: Secondary | ICD-10-CM | POA: Diagnosis not present

## 2015-07-14 DIAGNOSIS — Z794 Long term (current) use of insulin: Secondary | ICD-10-CM | POA: Diagnosis not present

## 2015-07-14 DIAGNOSIS — E113219 Type 2 diabetes mellitus with mild nonproliferative diabetic retinopathy with macular edema, unspecified eye: Secondary | ICD-10-CM | POA: Diagnosis not present

## 2015-07-14 DIAGNOSIS — I10 Essential (primary) hypertension: Secondary | ICD-10-CM | POA: Diagnosis not present

## 2015-10-19 DIAGNOSIS — E11319 Type 2 diabetes mellitus with unspecified diabetic retinopathy without macular edema: Secondary | ICD-10-CM | POA: Diagnosis not present

## 2015-10-19 DIAGNOSIS — H35033 Hypertensive retinopathy, bilateral: Secondary | ICD-10-CM | POA: Diagnosis not present

## 2015-10-19 DIAGNOSIS — H11159 Pinguecula, unspecified eye: Secondary | ICD-10-CM | POA: Diagnosis not present

## 2015-10-19 DIAGNOSIS — H52223 Regular astigmatism, bilateral: Secondary | ICD-10-CM | POA: Diagnosis not present

## 2015-10-19 DIAGNOSIS — H25099 Other age-related incipient cataract, unspecified eye: Secondary | ICD-10-CM | POA: Diagnosis not present

## 2015-10-19 DIAGNOSIS — I10 Essential (primary) hypertension: Secondary | ICD-10-CM | POA: Diagnosis not present

## 2015-10-19 DIAGNOSIS — H5203 Hypermetropia, bilateral: Secondary | ICD-10-CM | POA: Diagnosis not present

## 2015-10-19 DIAGNOSIS — H5210 Myopia, unspecified eye: Secondary | ICD-10-CM | POA: Diagnosis not present

## 2015-10-19 DIAGNOSIS — H524 Presbyopia: Secondary | ICD-10-CM | POA: Diagnosis not present

## 2015-10-24 DIAGNOSIS — J069 Acute upper respiratory infection, unspecified: Secondary | ICD-10-CM | POA: Diagnosis not present

## 2015-11-09 DIAGNOSIS — Z6836 Body mass index (BMI) 36.0-36.9, adult: Secondary | ICD-10-CM | POA: Diagnosis not present

## 2015-11-09 DIAGNOSIS — Z794 Long term (current) use of insulin: Secondary | ICD-10-CM | POA: Diagnosis not present

## 2015-11-09 DIAGNOSIS — E113219 Type 2 diabetes mellitus with mild nonproliferative diabetic retinopathy with macular edema, unspecified eye: Secondary | ICD-10-CM | POA: Diagnosis not present

## 2015-11-09 DIAGNOSIS — I10 Essential (primary) hypertension: Secondary | ICD-10-CM | POA: Diagnosis not present

## 2015-11-09 DIAGNOSIS — E1142 Type 2 diabetes mellitus with diabetic polyneuropathy: Secondary | ICD-10-CM | POA: Diagnosis not present

## 2015-11-29 DIAGNOSIS — G4733 Obstructive sleep apnea (adult) (pediatric): Secondary | ICD-10-CM | POA: Diagnosis not present

## 2015-12-11 DIAGNOSIS — G4733 Obstructive sleep apnea (adult) (pediatric): Secondary | ICD-10-CM | POA: Diagnosis not present

## 2016-01-11 DIAGNOSIS — G4733 Obstructive sleep apnea (adult) (pediatric): Secondary | ICD-10-CM | POA: Diagnosis not present

## 2016-01-11 DIAGNOSIS — I10 Essential (primary) hypertension: Secondary | ICD-10-CM | POA: Diagnosis not present

## 2016-01-30 ENCOUNTER — Ambulatory Visit (INDEPENDENT_AMBULATORY_CARE_PROVIDER_SITE_OTHER): Payer: Self-pay | Admitting: Physician Assistant

## 2016-01-30 VITALS — BP 140/82 | HR 76 | Temp 97.5°F | Resp 16 | Ht 76.0 in | Wt 285.0 lb

## 2016-01-30 DIAGNOSIS — Z0289 Encounter for other administrative examinations: Secondary | ICD-10-CM

## 2016-01-30 NOTE — Progress Notes (Signed)
Commercial Driver Medical Examination   Martin Gordon is a 70 y.o. male who presents today for a commercial driver fitness determination physical exam. The patient reports no problems. The following portions of the patient's history were reviewed and updated as appropriate: allergies, current medications, past family history, past medical history, past social history, past surgical history and problem list.  Hx DM -- last A1C around 7.5%. Has follow up with his PCP Dr. Lavone Orn in 1 month. Sees him every 3-6 months.   Takes amlodipine and zestoretic for his BP. Generally well controlled.  No tobacco use  Hx OSA -- uses CPAP every night. Does not have compliance report with him but states he can bring by.  Review of Systems Pertinent items are noted in HPI.   Objective:    Vision/hearing:  Visual Acuity Screening   Right eye Left eye Both eyes  Without correction:     With correction: 20/25 20/25 20/20   Comments: Peripheral Vision: Right eye 85 degrees. Left eye 85 degrees. Hearing -- whisper test 10 ft bilaterally  Applicant can recognize and distinguish among traffic control signals and devices showing standard red, green, and amber colors.  Corrective lenses required: Yes  Monocular Vision?: No  Hearing aid requirement: No  Physical Exam  Constitutional: He is oriented to person, place, and time. He appears well-developed and well-nourished.  HENT:  Head: Normocephalic and atraumatic.  Right Ear: Hearing, tympanic membrane, external ear and ear canal normal.  Left Ear: Hearing, tympanic membrane, external ear and ear canal normal.  Nose: Nose normal.  Mouth/Throat: Uvula is midline, oropharynx is clear and moist and mucous membranes are normal.  Eyes: Conjunctivae, EOM and lids are normal. Pupils are equal, round, and reactive to light. Right eye exhibits no discharge. Left eye exhibits no discharge. No scleral icterus.  Neck: Trachea normal and normal range of  motion. Neck supple. Carotid bruit is not present.  Cardiovascular: Normal rate, regular rhythm, normal heart sounds, intact distal pulses and normal pulses.   No murmur heard. Pulmonary/Chest: Effort normal and breath sounds normal. No respiratory distress. He has no wheezes. He has no rhonchi. He has no rales.  Abdominal: Soft. Normal appearance. There is no tenderness.  Small umbilical hernia, no tenderness.  Musculoskeletal: Normal range of motion. He exhibits no edema or tenderness.  Lymphadenopathy:       Head (right side): No submental, no submandibular and no tonsillar adenopathy present.       Head (left side): No submental, no submandibular and no tonsillar adenopathy present.    He has no cervical adenopathy.       Right: No supraclavicular adenopathy present.       Left: No supraclavicular adenopathy present.  Neurological: He is alert and oriented to person, place, and time. He has normal strength and normal reflexes. No cranial nerve deficit. Coordination and gait normal.  Skin: Skin is warm, dry and intact. No lesion and no rash noted.  Psychiatric: He has a normal mood and affect. His speech is normal and behavior is normal. Judgment and thought content normal.   BP 144/88 mmHg  Pulse 76  Temp(Src) 97.5 F (36.4 C) (Oral)  Resp 16  Ht 6\' 4"  (1.93 m)  Wt 285 lb (129.275 kg)  BMI 34.71 kg/m2  SpO2 95%  Labs:   SP GR 1.010  PRO neg  BLOOD neg  GLU neg  Assessment:    Healthy male exam.  Meets standards, but periodic monitoring required due to HTN,  DM, OSA.  Driver qualified only for 1 year.    Plan:    Medical examiners certificate completed and printed. Return as needed.  Pt will bring in CPAP compliance report for review. 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

## 2016-01-30 NOTE — Patient Instructions (Signed)
     IF you received an x-ray today, you will receive an invoice from [facility]. Please contact [facility] at [phone] with questions or concerns regarding your invoice.   IF you received labwork today, you will receive an invoice from [facility]. Please contact [facility] at [phone] with questions or concerns regarding your invoice.   Our billing staff will not be able to assist you with questions regarding bills from these companies.  You will be contacted with the lab results as soon as they are available. The fastest way to get your results is to activate your My Chart account. Instructions are located on the last page of this paperwork. If you have not heard from us regarding the results in 2 weeks, please contact this office.      

## 2016-01-30 NOTE — Progress Notes (Signed)
The patient can distinguish the colors red, amber and green.

## 2016-02-10 DIAGNOSIS — G4733 Obstructive sleep apnea (adult) (pediatric): Secondary | ICD-10-CM | POA: Diagnosis not present

## 2016-03-12 DIAGNOSIS — E113219 Type 2 diabetes mellitus with mild nonproliferative diabetic retinopathy with macular edema, unspecified eye: Secondary | ICD-10-CM | POA: Diagnosis not present

## 2016-03-12 DIAGNOSIS — Z794 Long term (current) use of insulin: Secondary | ICD-10-CM | POA: Diagnosis not present

## 2016-03-12 DIAGNOSIS — Z0000 Encounter for general adult medical examination without abnormal findings: Secondary | ICD-10-CM | POA: Diagnosis not present

## 2016-03-12 DIAGNOSIS — Z23 Encounter for immunization: Secondary | ICD-10-CM | POA: Diagnosis not present

## 2016-03-12 DIAGNOSIS — Z7189 Other specified counseling: Secondary | ICD-10-CM | POA: Diagnosis not present

## 2016-03-12 DIAGNOSIS — Z1389 Encounter for screening for other disorder: Secondary | ICD-10-CM | POA: Diagnosis not present

## 2016-03-12 DIAGNOSIS — I10 Essential (primary) hypertension: Secondary | ICD-10-CM | POA: Diagnosis not present

## 2016-03-12 DIAGNOSIS — E7800 Pure hypercholesterolemia, unspecified: Secondary | ICD-10-CM | POA: Diagnosis not present

## 2016-03-12 DIAGNOSIS — E1142 Type 2 diabetes mellitus with diabetic polyneuropathy: Secondary | ICD-10-CM | POA: Diagnosis not present

## 2016-03-12 DIAGNOSIS — Z6838 Body mass index (BMI) 38.0-38.9, adult: Secondary | ICD-10-CM | POA: Diagnosis not present

## 2016-03-12 DIAGNOSIS — Z1211 Encounter for screening for malignant neoplasm of colon: Secondary | ICD-10-CM | POA: Diagnosis not present

## 2016-03-12 DIAGNOSIS — G4733 Obstructive sleep apnea (adult) (pediatric): Secondary | ICD-10-CM | POA: Diagnosis not present

## 2016-04-10 DIAGNOSIS — G4733 Obstructive sleep apnea (adult) (pediatric): Secondary | ICD-10-CM | POA: Diagnosis not present

## 2016-04-11 ENCOUNTER — Other Ambulatory Visit: Payer: Self-pay | Admitting: Gastroenterology

## 2016-04-11 DIAGNOSIS — G4733 Obstructive sleep apnea (adult) (pediatric): Secondary | ICD-10-CM | POA: Diagnosis not present

## 2016-05-02 ENCOUNTER — Encounter (HOSPITAL_COMMUNITY): Payer: Self-pay | Admitting: *Deleted

## 2016-05-12 DIAGNOSIS — G4733 Obstructive sleep apnea (adult) (pediatric): Secondary | ICD-10-CM | POA: Diagnosis not present

## 2016-05-13 ENCOUNTER — Ambulatory Visit (HOSPITAL_COMMUNITY): Payer: Commercial Managed Care - HMO | Admitting: Anesthesiology

## 2016-05-13 ENCOUNTER — Encounter (HOSPITAL_COMMUNITY)
Admission: RE | Disposition: A | Discharge status: Home / Self Care | Payer: Self-pay | Source: Ambulatory Visit | Attending: Gastroenterology

## 2016-05-13 ENCOUNTER — Ambulatory Visit (HOSPITAL_COMMUNITY)
Admission: RE | Admit: 2016-05-13 | Discharge: 2016-05-13 | Disposition: A | Discharge status: Home / Self Care | Payer: Commercial Managed Care - HMO | Source: Ambulatory Visit | Attending: Gastroenterology | Admitting: Gastroenterology

## 2016-05-13 ENCOUNTER — Encounter (HOSPITAL_COMMUNITY): Payer: Self-pay

## 2016-05-13 DIAGNOSIS — Z86010 Personal history of colonic polyps: Secondary | ICD-10-CM | POA: Insufficient documentation

## 2016-05-13 DIAGNOSIS — I10 Essential (primary) hypertension: Secondary | ICD-10-CM | POA: Diagnosis not present

## 2016-05-13 DIAGNOSIS — E119 Type 2 diabetes mellitus without complications: Secondary | ICD-10-CM | POA: Insufficient documentation

## 2016-05-13 DIAGNOSIS — Z87891 Personal history of nicotine dependence: Secondary | ICD-10-CM | POA: Insufficient documentation

## 2016-05-13 DIAGNOSIS — Z538 Procedure and treatment not carried out for other reasons: Secondary | ICD-10-CM | POA: Diagnosis not present

## 2016-05-13 SURGERY — CANCELLED PROCEDURE
Anesthesia: Monitor Anesthesia Care

## 2016-05-13 SURGICAL SUPPLY — 21 items

## 2016-05-13 NOTE — Anesthesia Preprocedure Evaluation (Deleted)
Anesthesia Evaluation  Patient identified by MRN, date of birth, ID band Patient awake    Reviewed: Allergy & Precautions, NPO status , Patient's Chart, lab work & pertinent test results  History of Anesthesia Complications (+) POST - OP SPINAL HEADACHE  Airway Mallampati: II  TM Distance: >3 FB     Dental   Pulmonary former smoker,    breath sounds clear to auscultation       Cardiovascular hypertension,  Rhythm:Regular Rate:Normal     Neuro/Psych    GI/Hepatic Neg liver ROS, GI history noted. CE   Endo/Other  diabetes  Renal/GU negative Renal ROS     Musculoskeletal   Abdominal   Peds  Hematology   Anesthesia Other Findings   Reproductive/Obstetrics                            Anesthesia Physical Anesthesia Plan  ASA: III  Anesthesia Plan: MAC   Post-op Pain Management:    Induction: Intravenous  Airway Management Planned:   Additional Equipment:   Intra-op Plan:   Post-operative Plan:   Informed Consent: I have reviewed the patients History and Physical, chart, labs and discussed the procedure including the risks, benefits and alternatives for the proposed anesthesia with the patient or authorized representative who has indicated his/her understanding and acceptance.   Dental advisory given  Plan Discussed with: Anesthesiologist and CRNA  Anesthesia Plan Comments:         Anesthesia Quick Evaluation

## 2016-06-11 ENCOUNTER — Other Ambulatory Visit: Payer: Self-pay | Admitting: Gastroenterology

## 2016-06-12 ENCOUNTER — Encounter (HOSPITAL_COMMUNITY): Payer: Self-pay | Admitting: *Deleted

## 2016-06-12 DIAGNOSIS — G4733 Obstructive sleep apnea (adult) (pediatric): Secondary | ICD-10-CM | POA: Diagnosis not present

## 2016-06-17 ENCOUNTER — Encounter (HOSPITAL_COMMUNITY)
Admission: RE | Disposition: A | Discharge status: Home / Self Care | Payer: Self-pay | Source: Ambulatory Visit | Attending: Gastroenterology

## 2016-06-17 ENCOUNTER — Encounter (HOSPITAL_COMMUNITY): Payer: Self-pay | Admitting: Anesthesiology

## 2016-06-17 ENCOUNTER — Ambulatory Visit (HOSPITAL_COMMUNITY): Payer: Commercial Managed Care - HMO | Admitting: Certified Registered Nurse Anesthetist

## 2016-06-17 ENCOUNTER — Ambulatory Visit (HOSPITAL_COMMUNITY)
Admission: RE | Admit: 2016-06-17 | Discharge: 2016-06-17 | Disposition: A | Discharge status: Home / Self Care | Payer: Commercial Managed Care - HMO | Source: Ambulatory Visit | Attending: Gastroenterology | Admitting: Gastroenterology

## 2016-06-17 DIAGNOSIS — E669 Obesity, unspecified: Secondary | ICD-10-CM | POA: Diagnosis not present

## 2016-06-17 DIAGNOSIS — I10 Essential (primary) hypertension: Secondary | ICD-10-CM | POA: Diagnosis not present

## 2016-06-17 DIAGNOSIS — Z538 Procedure and treatment not carried out for other reasons: Secondary | ICD-10-CM | POA: Diagnosis not present

## 2016-06-17 DIAGNOSIS — E1142 Type 2 diabetes mellitus with diabetic polyneuropathy: Secondary | ICD-10-CM | POA: Diagnosis not present

## 2016-06-17 DIAGNOSIS — G4733 Obstructive sleep apnea (adult) (pediatric): Secondary | ICD-10-CM | POA: Diagnosis not present

## 2016-06-17 DIAGNOSIS — G5603 Carpal tunnel syndrome, bilateral upper limbs: Secondary | ICD-10-CM | POA: Diagnosis not present

## 2016-06-17 DIAGNOSIS — Z7984 Long term (current) use of oral hypoglycemic drugs: Secondary | ICD-10-CM | POA: Diagnosis not present

## 2016-06-17 DIAGNOSIS — Z87891 Personal history of nicotine dependence: Secondary | ICD-10-CM | POA: Insufficient documentation

## 2016-06-17 DIAGNOSIS — Z794 Long term (current) use of insulin: Secondary | ICD-10-CM | POA: Insufficient documentation

## 2016-06-17 DIAGNOSIS — E11319 Type 2 diabetes mellitus with unspecified diabetic retinopathy without macular edema: Secondary | ICD-10-CM | POA: Diagnosis not present

## 2016-06-17 DIAGNOSIS — Z8546 Personal history of malignant neoplasm of prostate: Secondary | ICD-10-CM | POA: Diagnosis not present

## 2016-06-17 DIAGNOSIS — Z6831 Body mass index (BMI) 31.0-31.9, adult: Secondary | ICD-10-CM | POA: Insufficient documentation

## 2016-06-17 DIAGNOSIS — E119 Type 2 diabetes mellitus without complications: Secondary | ICD-10-CM | POA: Diagnosis not present

## 2016-06-17 DIAGNOSIS — Z86010 Personal history of colonic polyps: Secondary | ICD-10-CM | POA: Diagnosis not present

## 2016-06-17 DIAGNOSIS — Z1211 Encounter for screening for malignant neoplasm of colon: Secondary | ICD-10-CM | POA: Diagnosis not present

## 2016-06-17 HISTORY — PX: FLEXIBLE SIGMOIDOSCOPY: SHX5431

## 2016-06-17 LAB — GLUCOSE, CAPILLARY: GLUCOSE-CAPILLARY: 108 mg/dL — AB (ref 65–99)

## 2016-06-17 SURGERY — SIGMOIDOSCOPY, FLEXIBLE
Anesthesia: Monitor Anesthesia Care

## 2016-06-17 MED ORDER — LIDOCAINE 2% (20 MG/ML) 5 ML SYRINGE
INTRAMUSCULAR | Status: DC | PRN
Start: 2016-06-17 — End: 2016-06-17
  Administered 2016-06-17: 50 mg via INTRAVENOUS

## 2016-06-17 MED ORDER — ONDANSETRON HCL 4 MG/2ML IJ SOLN
INTRAMUSCULAR | Status: AC
  Filled 2016-06-17: qty 2

## 2016-06-17 MED ORDER — LACTATED RINGERS IV SOLN
INTRAVENOUS | Status: DC
  Administered 2016-06-17: 1000 mL via INTRAVENOUS

## 2016-06-17 MED ORDER — SODIUM CHLORIDE 0.9 % IV SOLN: INTRAVENOUS | Status: DC

## 2016-06-17 MED ORDER — LIDOCAINE 2% (20 MG/ML) 5 ML SYRINGE
INTRAMUSCULAR | Status: AC
  Filled 2016-06-17: qty 5

## 2016-06-17 MED ORDER — PROPOFOL 10 MG/ML IV BOLUS
INTRAVENOUS | Status: AC
  Filled 2016-06-17: qty 40

## 2016-06-17 MED ORDER — ONDANSETRON HCL 4 MG/2ML IJ SOLN
INTRAMUSCULAR | Status: DC | PRN
  Administered 2016-06-17: 4 mg via INTRAVENOUS

## 2016-06-17 MED ORDER — PROPOFOL 500 MG/50ML IV EMUL
INTRAVENOUS | Status: DC | PRN
  Administered 2016-06-17: 150 ug/kg/min via INTRAVENOUS

## 2016-06-17 SURGICAL SUPPLY — 21 items

## 2016-06-17 NOTE — Anesthesia Postprocedure Evaluation (Signed)
Anesthesia Post Note  Patient: Martin Gordon  Procedure(s) Performed: * No procedures listed *  Patient location during evaluation: PACU Anesthesia Type: MAC Level of consciousness: awake and alert Pain management: pain level controlled Vital Signs Assessment: post-procedure vital signs reviewed and stable Respiratory status: spontaneous breathing, nonlabored ventilation, respiratory function stable and patient connected to nasal cannula oxygen Cardiovascular status: stable and blood pressure returned to baseline Anesthetic complications: no    Last Vitals:  Vitals:   06/17/16 1020 06/17/16 1025  BP: (!) 147/78 (!) 182/88  Pulse: (!) 52 (!) 47  Resp: 14 14  Temp:      Last Pain:  Vitals:   06/17/16 0832  TempSrc: Oral                 Priest Lockridge J

## 2016-06-17 NOTE — H&P (Signed)
Procedure: Surveillance colonoscopy. 06/18/2010 colonoscopy was performed with removal of a 5 mm tubular adenomatous sigmoid colon polyp  History: The patient is a 70 year old male born 08-Jun-1946. He is scheduled to undergo a surveillance colonoscopy today.  Past medical history: Type 2 diabetes mellitus complicated by peripheral neuropathy, history of foot ulcer, and diabetic retinopathy. Hypertension. Prostate cancer. Bilateral carpal tunnel syndrome. Obstructive sleep apnea syndrome. Right inguinal herniorrhaphy.  Exam: The patient is alert and lying comfortably on the endoscopy stretcher. Abdomen is soft and nontender to palpation. Lungs are clear to auscultation. Cardiac exam reveals a regular rhythm.  Plan: Proceed with surveillance colonoscopy

## 2016-06-17 NOTE — Discharge Instructions (Signed)
YOU HAD AN ENDOSCOPIC PROCEDURE TODAY: Refer to the procedure report and other information in the discharge instructions given to you for any specific questions about what was found during the examination. If this information does not answer your questions, please call Dr. Wynetta Emery office at [phone] to clarify.   YOU SHOULD EXPECT: Some feelings of bloating in the abdomen. Passage of more gas than usual. Walking can help get rid of the air that was put into your GI tract during the procedure and reduce the bloating. If you had a lower endoscopy (such as a colonoscopy or flexible sigmoidoscopy) you may notice spotting of blood in your stool or on the toilet paper. Some abdominal soreness may be present for a day or two, also.  DIET: Your first meal following the procedure should be a light meal and then it is ok to progress to your normal diet. A half-sandwich or bowl of soup is an example of a good first meal. Heavy or fried foods are harder to digest and may make you feel nauseous or bloated. Drink plenty of fluids but you should avoid alcoholic beverages for 24 hours. If you had a esophageal dilation, please see attached instructions for diet.   ACTIVITY: Your care partner should take you home directly after the procedure. You should plan to take it easy, moving slowly for the rest of the day. You can resume normal activity the day after the procedure however YOU SHOULD NOT DRIVE, use power tools, machinery or perform tasks that involve climbing or major physical exertion for 24 hours (because of the sedation medicines used during the test).   SYMPTOMS TO REPORT IMMEDIATELY: A gastroenterologist can be reached at any hour. Please call [phone]  for any of the following symptoms:  Following lower endoscopy (colonoscopy, flexible sigmoidoscopy) Excessive amounts of blood in the stool  Significant tenderness, worsening of abdominal pains  Swelling of the abdomen that is new, acute  Fever of 100  or higher  Following upper endoscopy (EGD, EUS, ERCP, esophageal dilation) Vomiting of blood or coffee ground material  New, significant abdominal pain  New, significant chest pain or pain under the shoulder blades  Painful or persistently difficult swallowing  New shortness of breath  Black, tarry-looking or red, bloody stools  FOLLOW UP:  If any biopsies were taken you will be contacted by phone or by letter within the next 1-3 weeks. Call [phone]  if you have not heard about the biopsies in 3 weeks.  Please also call with any specific questions about appointments or follow up tests.

## 2016-06-17 NOTE — Op Note (Signed)
[facility] Patient Name: Martin Gordon Procedure Date: 06/17/2016 MRN: [id number] Attending MD: Garlan Fair , MD Date of Birth: Jun 25, 1946 CSN: [id number] Age: 70 Admit Type: Outpatient Procedure:                canceled surveillance colonoscopy Indications:              High risk colon cancer surveillance: Personal                            history of non-advanced adenoma Providers:                Garlan Fair, MD, Zenon Mayo, RN, [facility], Technician, Christell Faith, CRNA Referring MD:              Medicines:                Propofol per Anesthesia Complications:            No immediate complications. Estimated Blood Loss:     Estimated blood loss: none. Procedure:                Pre-Anesthesia Assessment:                           - Prior to the procedure, a History and Physical                            was performed, and patient medications and                            allergies were reviewed. The patient's tolerance of                            previous anesthesia was also reviewed. The risks                            and benefits of the procedure and the sedation                            options and risks were discussed with the patient.                            All questions were answered, and informed consent                            was obtained. Prior Anticoagulants: The patient has                            taken aspirin, last dose was 7 days prior to                            procedure. ASA Grade Assessment: III - A patient  with severe systemic disease. After reviewing the                            risks and benefits, the patient was deemed in                            satisfactory condition to undergo the procedure.                           After obtaining informed consent, the colonoscope                            was passed under direct vision. Throughout the                       procedure, the patient's blood pressure, pulse, and                            oxygen saturations were monitored continuously. The                            [id number]) scope was introduced through                            the anus and advanced to the the sigmoid colon. The                            colonoscopy was performed without difficulty. The                            patient tolerated the procedure well. The quality                            of the bowel preparation was unsatisfactory. No                            anatomical landmarks were photographed. Scope In: 9:01:52 AM Scope Out: 9:29:53 AM Scope Withdrawal Time: 0 hours 9 minutes 39 seconds  Total Procedure Duration: 0 hours 28 minutes 1 second  Findings:      The perianal and digital rectal examinations were normal. the       surveillance colonoscopy was canceled due to an unsatisfactory colon       preparation. Impression:               - Preparation of the colon was unsatisfactory.                           - No specimens collected. Moderate Sedation:      N/A- Per Anesthesia Care Recommendation:           - Patient has a contact number available for                            emergencies. The signs and symptoms of potential  delayed complications were discussed with the                            patient. Return to normal activities tomorrow.                            Written discharge instructions were provided to the                            patient.                           - Repeat colonoscopy at the next available                            appointment for surveillance.                           - Resume previous diet.                           - Continue present medications. Procedure Code(s):        --- Professional ---                           [id number], 53, Colorectal cancer screening; colonoscopy                            on individual at high  risk Diagnosis Code(s):        --- Professional ---                           Z86.010, Personal history of colonic polyps CPT copyright 2016 [employer]. All rights reserved. The codes documented in this report are preliminary and upon coder review may  be revised to meet current compliance requirements. Earle Gell, MD Garlan Fair, MD 06/17/2016 10:11:21 AM This report has been signed electronically. Number of Addenda: 0

## 2016-06-17 NOTE — Anesthesia Procedure Notes (Signed)
Procedure Name: MAC Date/Time: 06/17/2016 9:55 AM Performed by: West Pugh Pre-anesthesia Checklist: Patient identified, Timeout performed, Emergency Drugs available, Suction available and Patient being monitored Patient Re-evaluated:Patient Re-evaluated prior to inductionOxygen Delivery Method: Simple face mask Dental Injury: Teeth and Oropharynx as per pre-operative assessment

## 2016-06-17 NOTE — Anesthesia Procedure Notes (Signed)
Procedures

## 2016-06-17 NOTE — Anesthesia Preprocedure Evaluation (Signed)
Anesthesia Evaluation  Patient identified by MRN, date of birth, ID band Patient awake    Reviewed: Allergy & Precautions, NPO status , Patient's Chart, lab work & pertinent test results  Airway Mallampati: II  TM Distance: >3 FB Neck ROM: Full    Dental no notable dental hx.    Pulmonary neg pulmonary ROS, former smoker,    Pulmonary exam normal breath sounds clear to auscultation       Cardiovascular hypertension, Normal cardiovascular exam Rhythm:Regular Rate:Normal     Neuro/Psych negative neurological ROS  negative psych ROS   GI/Hepatic negative GI ROS, Neg liver ROS,   Endo/Other  diabetes, Type 2, Oral Hypoglycemic Agents, Insulin Dependent  Renal/GU negative Renal ROS  negative genitourinary   Musculoskeletal negative musculoskeletal ROS (+)   Abdominal (+) + obese,   Peds negative pediatric ROS (+)  Hematology negative hematology ROS (+)   Anesthesia Other Findings   Reproductive/Obstetrics negative OB ROS                             Anesthesia Physical Anesthesia Plan  ASA: III  Anesthesia Plan: MAC   Post-op Pain Management:    Induction: Intravenous  Airway Management Planned: Natural Airway  Additional Equipment:   Intra-op Plan:   Post-operative Plan:   Informed Consent: I have reviewed the patients History and Physical, chart, labs and discussed the procedure including the risks, benefits and alternatives for the proposed anesthesia with the patient or authorized representative who has indicated his/her understanding and acceptance.   Dental advisory given  Plan Discussed with: CRNA  Anesthesia Plan Comments:         Anesthesia Quick Evaluation

## 2016-06-17 NOTE — Transfer of Care (Signed)
Immediate Anesthesia Transfer of Care Note  Patient: Martin Gordon  Procedure(s) Performed: * No procedures listed *  Patient Location: PACU  Anesthesia Type:MAC  Level of Consciousness:  sedated, patient cooperative and responds to stimulation  Airway & Oxygen Therapy:Patient Spontanous Breathing and Patient connected to face mask oxgen  Post-op Assessment:  Report given to PACU RN and Post -op Vital signs reviewed and stable  Post vital signs:  Reviewed and stable  Last Vitals:  Vitals:   06/17/16 0832 06/17/16 0833  BP:  (!) 186/95  Resp: (!) 6   Temp: 123XX123 C     Complications: No apparent anesthesia complications

## 2016-06-18 ENCOUNTER — Encounter (HOSPITAL_COMMUNITY): Payer: Self-pay | Admitting: Gastroenterology

## 2016-07-25 ENCOUNTER — Other Ambulatory Visit: Payer: Self-pay | Admitting: Gastroenterology

## 2016-08-18 ENCOUNTER — Encounter (HOSPITAL_COMMUNITY): Payer: Self-pay

## 2016-08-18 ENCOUNTER — Emergency Department (HOSPITAL_COMMUNITY)
Admission: EM | Admit: 2016-08-18 | Discharge: 2016-08-18 | Disposition: A | Discharge status: Home / Self Care | Payer: No Typology Code available for payment source | Attending: Physician Assistant | Admitting: Physician Assistant

## 2016-08-18 DIAGNOSIS — Y999 Unspecified external cause status: Secondary | ICD-10-CM | POA: Diagnosis not present

## 2016-08-18 DIAGNOSIS — M25562 Pain in left knee: Secondary | ICD-10-CM | POA: Insufficient documentation

## 2016-08-18 DIAGNOSIS — Y92410 Unspecified street and highway as the place of occurrence of the external cause: Secondary | ICD-10-CM | POA: Insufficient documentation

## 2016-08-18 DIAGNOSIS — Y9389 Activity, other specified: Secondary | ICD-10-CM | POA: Insufficient documentation

## 2016-08-18 DIAGNOSIS — I10 Essential (primary) hypertension: Secondary | ICD-10-CM | POA: Insufficient documentation

## 2016-08-18 DIAGNOSIS — M25561 Pain in right knee: Secondary | ICD-10-CM | POA: Insufficient documentation

## 2016-08-18 DIAGNOSIS — Z87891 Personal history of nicotine dependence: Secondary | ICD-10-CM | POA: Insufficient documentation

## 2016-08-18 DIAGNOSIS — M542 Cervicalgia: Secondary | ICD-10-CM | POA: Diagnosis not present

## 2016-08-18 DIAGNOSIS — Z7982 Long term (current) use of aspirin: Secondary | ICD-10-CM | POA: Insufficient documentation

## 2016-08-18 DIAGNOSIS — E119 Type 2 diabetes mellitus without complications: Secondary | ICD-10-CM | POA: Insufficient documentation

## 2016-08-18 DIAGNOSIS — Z79899 Other long term (current) drug therapy: Secondary | ICD-10-CM | POA: Insufficient documentation

## 2016-08-18 DIAGNOSIS — M546 Pain in thoracic spine: Secondary | ICD-10-CM | POA: Insufficient documentation

## 2016-08-18 DIAGNOSIS — Z794 Long term (current) use of insulin: Secondary | ICD-10-CM | POA: Diagnosis not present

## 2016-08-18 LAB — CBG MONITORING, ED: Glucose-Capillary: 97 mg/dL (ref 65–99)

## 2016-08-18 MED ORDER — CYCLOBENZAPRINE HCL 10 MG PO TABS: 10.0000 mg | ORAL_TABLET | Freq: Two times a day (BID) | ORAL | 0 refills | Status: DC | PRN

## 2016-08-18 MED ORDER — IBUPROFEN 800 MG PO TABS: 800.0000 mg | ORAL_TABLET | Freq: Three times a day (TID) | ORAL | 0 refills | Status: DC

## 2016-08-18 NOTE — Discharge Instructions (Signed)
Please return with any concerns such as numbness tingling or weakness.
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
## 2016-08-21 DIAGNOSIS — I10 Essential (primary) hypertension: Secondary | ICD-10-CM | POA: Diagnosis not present

## 2016-08-21 DIAGNOSIS — E1142 Type 2 diabetes mellitus with diabetic polyneuropathy: Secondary | ICD-10-CM | POA: Diagnosis not present

## 2016-08-21 DIAGNOSIS — Z794 Long term (current) use of insulin: Secondary | ICD-10-CM | POA: Diagnosis not present

## 2016-08-21 DIAGNOSIS — E113219 Type 2 diabetes mellitus with mild nonproliferative diabetic retinopathy with macular edema, unspecified eye: Secondary | ICD-10-CM | POA: Diagnosis not present

## 2016-08-21 DIAGNOSIS — Z23 Encounter for immunization: Secondary | ICD-10-CM | POA: Diagnosis not present

## 2016-09-23 ENCOUNTER — Encounter (HOSPITAL_COMMUNITY): Payer: Self-pay | Admitting: *Deleted

## 2016-09-24 ENCOUNTER — Ambulatory Visit (HOSPITAL_COMMUNITY): Payer: Medicare HMO | Admitting: Registered Nurse

## 2016-09-24 ENCOUNTER — Encounter (HOSPITAL_COMMUNITY)
Admission: RE | Disposition: A | Discharge status: Home / Self Care | Payer: Self-pay | Source: Ambulatory Visit | Attending: Gastroenterology

## 2016-09-24 ENCOUNTER — Ambulatory Visit (HOSPITAL_COMMUNITY)
Admission: RE | Admit: 2016-09-24 | Discharge: 2016-09-24 | Disposition: A | Discharge status: Home / Self Care | Payer: Medicare HMO | Source: Ambulatory Visit | Attending: Gastroenterology | Admitting: Gastroenterology

## 2016-09-24 ENCOUNTER — Encounter (HOSPITAL_COMMUNITY): Payer: Self-pay

## 2016-09-24 DIAGNOSIS — Z87891 Personal history of nicotine dependence: Secondary | ICD-10-CM | POA: Diagnosis not present

## 2016-09-24 DIAGNOSIS — I10 Essential (primary) hypertension: Secondary | ICD-10-CM | POA: Diagnosis not present

## 2016-09-24 DIAGNOSIS — D125 Benign neoplasm of sigmoid colon: Secondary | ICD-10-CM | POA: Insufficient documentation

## 2016-09-24 DIAGNOSIS — D128 Benign neoplasm of rectum: Secondary | ICD-10-CM | POA: Diagnosis not present

## 2016-09-24 DIAGNOSIS — Z8546 Personal history of malignant neoplasm of prostate: Secondary | ICD-10-CM | POA: Insufficient documentation

## 2016-09-24 DIAGNOSIS — G4733 Obstructive sleep apnea (adult) (pediatric): Secondary | ICD-10-CM | POA: Diagnosis not present

## 2016-09-24 DIAGNOSIS — E11319 Type 2 diabetes mellitus with unspecified diabetic retinopathy without macular edema: Secondary | ICD-10-CM | POA: Diagnosis not present

## 2016-09-24 DIAGNOSIS — D122 Benign neoplasm of ascending colon: Secondary | ICD-10-CM | POA: Diagnosis not present

## 2016-09-24 DIAGNOSIS — Z1211 Encounter for screening for malignant neoplasm of colon: Secondary | ICD-10-CM | POA: Insufficient documentation

## 2016-09-24 DIAGNOSIS — N529 Male erectile dysfunction, unspecified: Secondary | ICD-10-CM | POA: Diagnosis not present

## 2016-09-24 DIAGNOSIS — Z86010 Personal history of colonic polyps: Secondary | ICD-10-CM | POA: Insufficient documentation

## 2016-09-24 DIAGNOSIS — R69 Illness, unspecified: Secondary | ICD-10-CM | POA: Diagnosis not present

## 2016-09-24 HISTORY — PX: COLONOSCOPY WITH PROPOFOL: SHX5780

## 2016-09-24 LAB — GLUCOSE, CAPILLARY: Glucose-Capillary: 151 mg/dL — ABNORMAL HIGH (ref 65–99)

## 2016-09-24 SURGERY — COLONOSCOPY WITH PROPOFOL
Anesthesia: Monitor Anesthesia Care

## 2016-09-24 MED ORDER — LACTATED RINGERS IV SOLN
INTRAVENOUS | Status: DC
  Administered 2016-09-24: 10:00:00 via INTRAVENOUS

## 2016-09-24 MED ORDER — PROPOFOL 10 MG/ML IV BOLUS
INTRAVENOUS | Status: AC
  Filled 2016-09-24: qty 40

## 2016-09-24 MED ORDER — SODIUM CHLORIDE 0.9 % IV SOLN: INTRAVENOUS | Status: DC

## 2016-09-24 MED ORDER — EPHEDRINE 5 MG/ML INJ
INTRAVENOUS | Status: AC
  Filled 2016-09-24: qty 10

## 2016-09-24 MED ORDER — EPHEDRINE SULFATE-NACL 50-0.9 MG/10ML-% IV SOSY
PREFILLED_SYRINGE | INTRAVENOUS | Status: DC | PRN
  Administered 2016-09-24 (×2): 5 mg via INTRAVENOUS

## 2016-09-24 MED ORDER — PROPOFOL 500 MG/50ML IV EMUL
INTRAVENOUS | Status: DC | PRN
  Administered 2016-09-24: 140 ug/kg/min via INTRAVENOUS

## 2016-09-24 MED ORDER — PROPOFOL 10 MG/ML IV BOLUS
INTRAVENOUS | Status: DC | PRN
  Administered 2016-09-24: 50 mg via INTRAVENOUS

## 2016-09-24 MED ORDER — PROPOFOL 10 MG/ML IV BOLUS
INTRAVENOUS | Status: AC
  Filled 2016-09-24: qty 20

## 2016-09-24 MED ORDER — LIDOCAINE 2% (20 MG/ML) 5 ML SYRINGE
INTRAMUSCULAR | Status: DC | PRN
  Administered 2016-09-24: 100 mg via INTRAVENOUS

## 2016-09-24 MED ORDER — LIDOCAINE 2% (20 MG/ML) 5 ML SYRINGE
INTRAMUSCULAR | Status: AC
  Filled 2016-09-24: qty 5

## 2016-09-24 SURGICAL SUPPLY — 21 items

## 2016-09-24 NOTE — Op Note (Signed)
[facility] Patient Name: Martin Gordon Procedure Date: 09/24/2016 MRN: [id number] Attending MD: Garlan Fair , MD Date of Birth: 07-19-1946 CSN: [id number] Age: 71 Admit Type: Outpatient Procedure:                Colonoscopy Indications:              High risk colon cancer surveillance: Personal                            history of non-advanced adenoma Providers:                Garlan Fair, MD, Laverta Baltimore RN, RN,                            William Dalton, Technician Referring MD:              Medicines:                Propofol per Anesthesia Complications:            No immediate complications. Estimated Blood Loss:     Estimated blood loss was minimal. Procedure:                Pre-Anesthesia Assessment:                           - Prior to the procedure, a History and Physical                            was performed, and patient medications and                            allergies were reviewed. The patient's tolerance of                            previous anesthesia was also reviewed. The risks                            and benefits of the procedure and the sedation                            options and risks were discussed with the patient.                            All questions were answered, and informed consent                            was obtained. Prior Anticoagulants: The patient has                            taken aspirin, last dose was 1 day prior to                            procedure. ASA Grade Assessment: III - A patient  with severe systemic disease. After reviewing the                            risks and benefits, the patient was deemed in                            satisfactory condition to undergo the procedure.                           After obtaining informed consent, the colonoscope                            was passed under direct vision. Throughout the                            procedure,  the patient's blood pressure, pulse, and                            oxygen saturations were monitored continuously. The                            EC-[id number]) scope was introduced through                            the anus and advanced to the the cecum, identified                            by appendiceal orifice and ileocecal valve. The                            colonoscopy was performed without difficulty. The                            patient tolerated the procedure well. The quality                            of the bowel preparation was adequate. The                            appendiceal orifice and the rectum were                            photographed. Findings:      The perianal and digital rectal examinations were normal.      A 5 mm polyp was found in the distal ascending colon. The polyp was       sessile. The polyp was removed with a cold snare. Resection was       complete, but the polyp tissue was not retrieved.      A 3 mm polyp was found in the proximal sigmoid colon. The polyp was       sessile. The polyp was removed with a cold biopsy forceps. Resection and       retrieval were complete.      A 5 mm polyp was found in the rectum. The polyp  was sessile. The polyp       was removed with a cold snare. Resection and retrieval were complete.      The exam was otherwise without abnormality. Impression:               - One 5 mm polyp in the distal ascending colon,                            removed with a cold snare. Complete resection.                            Polyp tissue not retrieved.                           - One 3 mm polyp in the proximal sigmoid colon,                            removed with a cold biopsy forceps. Resected and                            retrieved.                           - One 5 mm polyp in the rectum, removed with a cold                            snare. Resected and retrieved.                           - The examination was  otherwise normal. Moderate Sedation:      N/A- Per Anesthesia Care Recommendation:           - Patient has a contact number available for                            emergencies. The signs and symptoms of potential                            delayed complications were discussed with the                            patient. Return to normal activities tomorrow.                            Written discharge instructions were provided to the                            patient.                           - Repeat colonoscopy date to be determined after                            pending pathology results are reviewed for  surveillance.                           - Resume previous diet.                           - Continue present medications. Procedure Code(s):        --- Professional ---                           [phone], Colonoscopy, flexible; with removal of                            tumor(s), polyp(s), or other lesion(s) by snare                            technique                           45380, 74, Colonoscopy, flexible; with biopsy,                            single or multiple Diagnosis Code(s):        --- Professional ---                           Z86.010, Personal history of colonic polyps                           D12.2, Benign neoplasm of ascending colon                           D12.5, Benign neoplasm of sigmoid colon                           K62.1, Rectal polyp CPT copyright 2016 American Medical Association. All rights reserved. The codes documented in this report are preliminary and upon coder review may  be revised to meet current compliance requirements. Earle Gell, MD Garlan Fair, MD 09/24/2016 11:19:48 AM This report has been signed electronically. Number of Addenda: 0

## 2016-09-24 NOTE — H&P (Signed)
Procedure: Surveillance colonoscopy. 06/18/2010 colonoscopy was performed with removal of a 5 mm tubular adenomatous sigmoid colon polyp  History: The patient is a 71 year old male born 1946-09-02. He is scheduled to undergo a surveillance colonoscopy today.  Past medical history: Type 2 diabetes mellitus complicated by peripheral neuropathy and diabetic retinopathy. Hypertension. Prostate cancer. Severe obstructive sleep apnea. Right inguinal herniorrhaphy.  Exam: Patient is alert and lying comfortably on the endoscopy stretcher. Abdomen is soft and nontender to palpation. Cardiac exam reveals a regular rhythm. Lungs are clear to auscultation.  Plan: Proceed with surveillance colonoscopy

## 2016-09-24 NOTE — Transfer of Care (Signed)
Immediate Anesthesia Transfer of Care Note  Patient: Martin Gordon  Procedure(s) Performed: Procedure(s): COLONOSCOPY WITH PROPOFOL (N/A)  Patient Location: PACU and Endoscopy Unit  Anesthesia Type:MAC  Level of Consciousness: awake, alert , oriented and patient cooperative  Airway & Oxygen Therapy: Patient Spontanous Breathing and Patient connected to face mask oxygen  Post-op Assessment: Report given to RN, Post -op Vital signs reviewed and stable and Patient moving all extremities  Post vital signs: Reviewed and stable  Last Vitals:  Vitals:   09/24/16 1010  BP: (!) 191/72  Pulse: 65  Resp: (!) 9  Temp: 36.6 C    Last Pain:  Vitals:   09/24/16 1010  TempSrc: Oral         Complications: No apparent anesthesia complications

## 2016-09-24 NOTE — Anesthesia Preprocedure Evaluation (Signed)
Anesthesia Evaluation  Patient identified by MRN, date of birth, ID band Patient awake    Reviewed: Allergy & Precautions, H&P , NPO status , Patient's Chart, lab work & pertinent test results  Airway Mallampati: II   Neck ROM: full    Dental   Pulmonary former smoker,    breath sounds clear to auscultation       Cardiovascular hypertension,  Rhythm:regular Rate:Normal     Neuro/Psych    GI/Hepatic   Endo/Other  diabetes, Type 2  Renal/GU      Musculoskeletal   Abdominal   Peds  Hematology   Anesthesia Other Findings   Reproductive/Obstetrics                             Anesthesia Physical Anesthesia Plan  ASA: II  Anesthesia Plan: MAC   Post-op Pain Management:    Induction: Intravenous  Airway Management Planned: Simple Face Mask  Additional Equipment:   Intra-op Plan:   Post-operative Plan:   Informed Consent: I have reviewed the patients History and Physical, chart, labs and discussed the procedure including the risks, benefits and alternatives for the proposed anesthesia with the patient or authorized representative who has indicated his/her understanding and acceptance.     Plan Discussed with: CRNA, Anesthesiologist and Surgeon  Anesthesia Plan Comments:         Anesthesia Quick Evaluation

## 2016-09-24 NOTE — Discharge Instructions (Signed)

## 2016-09-24 NOTE — Anesthesia Postprocedure Evaluation (Signed)
Anesthesia Post Note  Patient: Martin Gordon  Procedure(s) Performed: Procedure(s) (LRB): COLONOSCOPY WITH PROPOFOL (N/A)  Patient location during evaluation: PACU Anesthesia Type: MAC Level of consciousness: awake and alert Pain management: pain level controlled Vital Signs Assessment: post-procedure vital signs reviewed and stable Respiratory status: spontaneous breathing, nonlabored ventilation, respiratory function stable and patient connected to nasal cannula oxygen Cardiovascular status: stable and blood pressure returned to baseline Anesthetic complications: no       Last Vitals:  Vitals:   09/24/16 1010 09/24/16 1119  BP: (!) 191/72   Pulse: 65 69  Resp: (!) 9 (!) 7  Temp: 36.6 C     Last Pain:  Vitals:   09/24/16 1119  TempSrc: Oral                 Auther Lyerly S

## 2016-09-27 ENCOUNTER — Encounter (HOSPITAL_COMMUNITY): Payer: Self-pay | Admitting: Gastroenterology

## 2017-01-23 DIAGNOSIS — Z794 Long term (current) use of insulin: Secondary | ICD-10-CM | POA: Diagnosis not present

## 2017-01-23 DIAGNOSIS — Z7984 Long term (current) use of oral hypoglycemic drugs: Secondary | ICD-10-CM | POA: Diagnosis not present

## 2017-01-23 DIAGNOSIS — E1142 Type 2 diabetes mellitus with diabetic polyneuropathy: Secondary | ICD-10-CM | POA: Diagnosis not present

## 2017-01-23 DIAGNOSIS — I10 Essential (primary) hypertension: Secondary | ICD-10-CM | POA: Diagnosis not present

## 2017-01-23 DIAGNOSIS — E1165 Type 2 diabetes mellitus with hyperglycemia: Secondary | ICD-10-CM | POA: Diagnosis not present

## 2017-01-23 DIAGNOSIS — E11621 Type 2 diabetes mellitus with foot ulcer: Secondary | ICD-10-CM | POA: Diagnosis not present

## 2017-01-27 ENCOUNTER — Ambulatory Visit (INDEPENDENT_AMBULATORY_CARE_PROVIDER_SITE_OTHER): Payer: Medicare HMO

## 2017-01-27 ENCOUNTER — Ambulatory Visit (INDEPENDENT_AMBULATORY_CARE_PROVIDER_SITE_OTHER): Payer: Medicare HMO | Admitting: Orthopedic Surgery

## 2017-01-27 DIAGNOSIS — L97529 Non-pressure chronic ulcer of other part of left foot with unspecified severity: Secondary | ICD-10-CM

## 2017-01-27 DIAGNOSIS — M86272 Subacute osteomyelitis, left ankle and foot: Secondary | ICD-10-CM | POA: Diagnosis not present

## 2017-01-27 DIAGNOSIS — E11621 Type 2 diabetes mellitus with foot ulcer: Secondary | ICD-10-CM

## 2017-01-27 MED ORDER — MUPIROCIN 2 % EX OINT: 1.0000 "application " | TOPICAL_OINTMENT | Freq: Every day | CUTANEOUS | 6 refills | Status: DC

## 2017-01-27 NOTE — Progress Notes (Signed)
Office Visit Note   Patient: Martin Gordon           Date of Birth: May 20, 1946           MRN: [id number] Visit Date: 01/27/2017              Requested by: Lavone Orn, MD [address] [place], [place] [address] PCP: Lavone Orn, MD  No chief complaint on file.     HPI: Patient is a 71 year old gentleman who presents today in referral from Dr. Laurann Montana. Has a diabetic ulcer beneath his left great toe. Patient states this is chronic. This is been ongoing since prior to 2012. States he did initially follow with the wound center but stops attending visits. Was having a routine physical with Dr. Laurann Montana. During his foot check this ulcer was noticed. Patient states he has not been doing any dressing change denies pain. Does endorse having diabetic neuropathy. States his A1c has been running between 7 and 8.  Assessment & Plan: Visit Diagnoses:  1. Diabetic ulcer of left great toe ([place])   2. Subacute osteomyelitis of left foot ([place])     Plan: Discussed osteomyelitis and related amputation surgery as consequence. Patient would like to continue with conservative measures. Will have him begin daily wound cleansing and wound care. Follow up in 3 weeks.   Follow-Up Instructions: Return in about 3 weeks (around 02/17/2017).   Ortho Exam  Patient is alert, oriented, no adenopathy, well-dressed, normal affect, normal respiratory effort. Ulceration to plantar aspect of left great toe distal phalanx. This is 7 mm in diameter is 8 mm deep. There is necrotic tissue in wound bed. Does not probe to bone. Scant clear drainage. Does have swelling to the great toe, has been chronic. No cellulitis or abscess. Does have strong DP pulse.   Imaging: Xr Toe Great Left  Result Date: 01/27/2017 Radiographs of left great toe show plantar ulceration with tracting down to bone. There are destructive changes to DIP and MTP joints.    Labs: No results found for: HGBA1C, ESRSEDRATE, CRP, LABURIC,  REPTSTATUS, GRAMSTAIN, CULT, LABORGA  Orders:  Orders Placed This Encounter  Procedures  . XR Toe Great Left   Meds ordered this encounter  Medications  . mupirocin ointment (BACTROBAN) 2 %    Sig: Apply 1 application topically daily.    Dispense:  22 g    Refill:  6     Procedures: No procedures performed  Clinical Data: No additional findings.  ROS:  All other systems negative, except as noted in the HPI. Review of Systems  Constitutional: Negative for chills and fever.  Skin: Positive for wound. Negative for color change.  Neurological: Positive for numbness.    Objective: Vital Signs: There were no vitals taken for this visit.  Specialty Comments:  No specialty comments available.  PMFS History: Patient Active Problem List   Diagnosis Date Noted  . Erectile dysfunction 07/04/2014   Past Medical History:  Diagnosis Date  . Diabetes mellitus without complication ([place])    oral med and insulin use  . Hyperlipidemia   . Hypertension     No family history on file.  Past Surgical History:  Procedure Laterality Date  . COLONOSCOPY WITH PROPOFOL N/A 09/24/2016   Procedure: COLONOSCOPY WITH PROPOFOL;  Surgeon: Garlan Fair, MD;  Location: [facility];  Service: Endoscopy;  Laterality: N/A;  . FLEXIBLE SIGMOIDOSCOPY N/A 06/17/2016   Procedure: FLEXIBLE SIGMOIDOSCOPY;  Surgeon: Garlan Fair, MD;  Location:  [facility];  Service: Endoscopy;  Laterality: N/A;  Scope advanced to Sigmoid colon, Full colonoscopy aborted due to unsatisfactory prep.  Marland Kitchen HERNIA REPAIR     hx of   . PENILE PROSTHESIS IMPLANT N/A 07/04/2014   Procedure: PENILE PROTHESIS INFLATABLE, Three piece;  Surgeon: Claybon Jabs, MD;  Location: [facility];  Service: Urology;  Laterality: N/A;   Social History   Occupational History  . Not on file.   Social History Main Topics  . Smoking status: Former Smoker    Packs/day: 2.00    Years: 30.00    Types: Cigarettes     Quit date: 09/16/1994  . Smokeless tobacco: Never Used  . Alcohol use No     Comment: past hx stopped in 1993  . Drug use: No  . Sexual activity: Not on file

## 2017-02-17 ENCOUNTER — Ambulatory Visit (INDEPENDENT_AMBULATORY_CARE_PROVIDER_SITE_OTHER): Payer: Medicare HMO | Admitting: Orthopedic Surgery

## 2017-02-21 ENCOUNTER — Encounter (INDEPENDENT_AMBULATORY_CARE_PROVIDER_SITE_OTHER): Payer: Self-pay | Admitting: Orthopedic Surgery

## 2017-02-21 ENCOUNTER — Ambulatory Visit (INDEPENDENT_AMBULATORY_CARE_PROVIDER_SITE_OTHER): Payer: Medicare HMO | Admitting: Orthopedic Surgery

## 2017-02-21 VITALS — Ht 76.0 in | Wt 270.0 lb

## 2017-02-21 DIAGNOSIS — M86272 Subacute osteomyelitis, left ankle and foot: Secondary | ICD-10-CM | POA: Insufficient documentation

## 2017-02-21 DIAGNOSIS — L97529 Non-pressure chronic ulcer of other part of left foot with unspecified severity: Secondary | ICD-10-CM

## 2017-02-21 DIAGNOSIS — E11621 Type 2 diabetes mellitus with foot ulcer: Secondary | ICD-10-CM | POA: Insufficient documentation

## 2017-02-21 NOTE — Progress Notes (Signed)
Office Visit Note   Patient: Martin Gordon           Date of Birth: [birth year]           MRN: [id number] Visit Date: 02/21/2017              Requested by: Lavone Orn, MD [address] [place], [place] [address] PCP: Lavone Orn, MD  Chief Complaint  Patient presents with  . Left Foot - Follow-up    Chronic left foot ulcer      HPI: Patient is a 71 year old gentleman diabetic insensate neuropathy with a Wagner grade 3 ulcer left great toe. Patient denies any drainage cellulitis or pain he denies any concerns.  Assessment & Plan: Visit Diagnoses:  1. Diabetic ulcer of left great toe ([place])   2. Subacute osteomyelitis of left foot ([place])     Plan: Discussed the patient with the osteomyelitis would recommend amputation of the left great toe through the MTP joint. 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 Patient states he wants to take some time to think about this he will follow up with [place] next week or call if he wants to schedule surgery.  Follow-Up Instructions: Return in about 1 week (around 02/28/2017).   Ortho Exam  Patient is alert, oriented, no adenopathy, well-dressed, normal affect, normal respiratory effort. Examination patient has an antalgic gait he has a palpable dorsalis pedis pulse. His foot is warm there is no cellulitis no signs of infection no ischemic changes. Patient has a Wagner grade 3 ulcer beneath the tip of the left great toe. After informed consent a 10 blade knife was used to debride the skin and soft tissue back to healthy viable granulation tissue this was touched with silver nitrate. The ulcer is 7 mm in diameter and 1 cm deep this probes all the way to the tuft of the great toe. Radiographs shows destructive arthritic changes of the great toe MTP joint as well as destructive changes of the tuft of the great  toe consistent with osteomyelitis. Patient does have sausage digit swelling of the great toe consistent with a chronic infection but there is no cellulitis or drainage or odor.  Imaging: No results found.  Labs: No results found for: HGBA1C, ESRSEDRATE, CRP, LABURIC, REPTSTATUS, GRAMSTAIN, CULT, LABORGA  Orders:  No orders of the defined types were placed in this encounter.  No orders of the defined types were placed in this encounter.    Procedures: No procedures performed  Clinical Data: No additional findings.  ROS:  All other systems negative, except as noted in the HPI. Review of Systems  Objective: Vital Signs: Ht 6\' 4"  (1.93 m)   Wt 270 lb (122.5 kg)   BMI 32.87 kg/m   Specialty Comments:  No specialty comments available.  PMFS History: Patient Active Problem List   Diagnosis Date Noted  . Diabetic ulcer of left great toe ([place]) 02/21/2017  . Subacute osteomyelitis of left foot ([place]) 02/21/2017  . Erectile dysfunction 07/04/2014   Past Medical History:  Diagnosis Date  . Diabetes mellitus without complication ([place])    oral med and insulin use  . Hyperlipidemia   . Hypertension     No family history on file.  Past Surgical History:  Procedure Laterality Date  . COLONOSCOPY WITH PROPOFOL N/A 09/24/2016   Procedure: COLONOSCOPY  WITH PROPOFOL;  Surgeon: Garlan Fair, MD;  [facility];  Service: Endoscopy;  Laterality: N/A;  . FLEXIBLE SIGMOIDOSCOPY N/A 06/17/2016   Procedure: FLEXIBLE SIGMOIDOSCOPY;  Surgeon: Garlan Fair, MD;  [facility];  Service: Endoscopy;  Laterality: N/A;  Scope advanced to Sigmoid colon, Full colonoscopy aborted due to unsatisfactory prep.  Marland Kitchen HERNIA REPAIR     hx of   . PENILE PROSTHESIS IMPLANT N/A 07/04/2014   Procedure: PENILE PROTHESIS INFLATABLE, Three piece;  Surgeon: Claybon Jabs, MD;  Location: [facility];  Service: Urology;  Laterality: N/A;   Social History   Occupational  History  . Not on file.   Social History Main Topics  . Smoking status: Former Smoker    Packs/day: 2.00    Years: 30.00    Types: Cigarettes    Quit date: 09/16/1994  . Smokeless tobacco: Never Used  . Alcohol use No     Comment: past hx stopped in 1993  . Drug use: No  . Sexual activity: Not on file

## 2017-02-28 ENCOUNTER — Ambulatory Visit (INDEPENDENT_AMBULATORY_CARE_PROVIDER_SITE_OTHER): Payer: Medicare HMO | Admitting: Orthopedic Surgery

## 2017-03-20 ENCOUNTER — Ambulatory Visit (INDEPENDENT_AMBULATORY_CARE_PROVIDER_SITE_OTHER): Payer: Medicare HMO | Admitting: Family

## 2017-03-20 ENCOUNTER — Encounter (INDEPENDENT_AMBULATORY_CARE_PROVIDER_SITE_OTHER): Payer: Self-pay | Admitting: Family

## 2017-03-20 VITALS — Ht 76.0 in | Wt 270.0 lb

## 2017-03-20 DIAGNOSIS — M86272 Subacute osteomyelitis, left ankle and foot: Secondary | ICD-10-CM | POA: Diagnosis not present

## 2017-03-20 DIAGNOSIS — L97529 Non-pressure chronic ulcer of other part of left foot with unspecified severity: Secondary | ICD-10-CM

## 2017-03-20 DIAGNOSIS — E11621 Type 2 diabetes mellitus with foot ulcer: Secondary | ICD-10-CM | POA: Diagnosis not present

## 2017-03-20 NOTE — Progress Notes (Signed)
Office Visit Note   Patient: Martin Gordon           Date of Birth: [birth year]           MRN: [id number] Visit Date: 03/20/2017              Requested by: Lavone Orn, MD [address] [place], [place] [address] PCP: Lavone Orn, MD  Chief Complaint  Patient presents with  . Left Great Toe - Follow-up      HPI: Patient is a 71 year old gentleman diabetic insensate neuropathy with a Wagner grade 3 ulcer left great toe. Patient denies any drainage cellulitis or pain he denies any concerns.  Sharol Given had previously recommended amputation of the left great toe through the MTP joint.  Patient would like to avoid surgery if possible.  Assessment & Plan: Visit Diagnoses:  1. Diabetic ulcer of left great toe ([place])   2. Subacute osteomyelitis of left foot ([place])     Plan: continue with daily foot checks. Follow up in office for clincal re check in 2 months.   Follow-Up Instructions: Return in about 2 months (around 05/21/2017).   Ortho Exam  Patient is alert, oriented, no adenopathy, well-dressed, normal affect, normal respiratory effort. Examination patient has an antalgic gait he has a palpable dorsalis pedis pulse. His foot is warm there is no cellulitis no signs of infection no ischemic changes.  Currently no open ulcer. Was debrided of hypertrophic callus back to viable tissue. No open area or drainage.   Radiographs shows destructive arthritic changes of the great toe MTP joint as well as destructive changes of the tuft of the great toe consistent with osteomyelitis. Patient does have sausage digit swelling of the great toe consistent with a chronic infection but there is no cellulitis or drainage or odor.  Imaging: No results found.  Labs: No results found for: HGBA1C, ESRSEDRATE, CRP, LABURIC, REPTSTATUS, GRAMSTAIN, CULT, LABORGA  Orders:  No orders of the defined types were placed in this encounter.  No orders of the defined types were placed in this  encounter.    Procedures: No procedures performed  Clinical Data: No additional findings.  ROS:  All other systems negative, except as noted in the HPI. Review of Systems  Constitutional: Negative for chills and fever.  Skin: Negative for color change and wound.    Objective: Vital Signs: Ht 6\' 4"  (1.93 m)   Wt 270 lb (122.5 kg)   BMI 32.87 kg/m   Specialty Comments:  No specialty comments available.  PMFS History: Patient Active Problem List   Diagnosis Date Noted  . Diabetic ulcer of left great toe ([place]) 02/21/2017  . Subacute osteomyelitis of left foot ([place]) 02/21/2017  . Erectile dysfunction 07/04/2014   Past Medical History:  Diagnosis Date  . Diabetes mellitus without complication ([place])    oral med and insulin use  . Hyperlipidemia   . Hypertension     History reviewed. No pertinent family history.  Past Surgical History:  Procedure Laterality Date  . COLONOSCOPY WITH PROPOFOL N/A 09/24/2016   Procedure: COLONOSCOPY WITH PROPOFOL;  Surgeon: Garlan Fair, MD;  [facility];  Service: Endoscopy;  Laterality: N/A;  . FLEXIBLE SIGMOIDOSCOPY N/A 06/17/2016   Procedure: FLEXIBLE SIGMOIDOSCOPY;  Surgeon: Garlan Fair, MD;  [facility];  Service: Endoscopy;  Laterality: N/A;  Scope advanced to Sigmoid colon, Full colonoscopy aborted due to unsatisfactory prep.  Marland Kitchen HERNIA REPAIR     hx of   .  PENILE PROSTHESIS IMPLANT N/A 07/04/2014   Procedure: PENILE PROTHESIS INFLATABLE, Three piece;  Surgeon: Claybon Jabs, MD;  Location: [facility];  Service: Urology;  Laterality: N/A;   Social History   Occupational History  . Not on file.   Social History Main Topics  . Smoking status: Former Smoker    Packs/day: 2.00    Years: 30.00    Types: Cigarettes    Quit date: 09/16/1994  . Smokeless tobacco: Never Used  . Alcohol use No     Comment: past hx stopped in 1993  . Drug use: No  . Sexual activity: Not on file

## 2017-05-22 ENCOUNTER — Ambulatory Visit (INDEPENDENT_AMBULATORY_CARE_PROVIDER_SITE_OTHER): Payer: Medicare HMO | Admitting: Family

## 2017-06-30 ENCOUNTER — Encounter (INDEPENDENT_AMBULATORY_CARE_PROVIDER_SITE_OTHER): Payer: Self-pay | Admitting: Family

## 2017-06-30 ENCOUNTER — Ambulatory Visit (INDEPENDENT_AMBULATORY_CARE_PROVIDER_SITE_OTHER): Payer: Medicare HMO | Admitting: Family

## 2017-06-30 DIAGNOSIS — Z794 Long term (current) use of insulin: Secondary | ICD-10-CM | POA: Diagnosis not present

## 2017-06-30 DIAGNOSIS — Z0000 Encounter for general adult medical examination without abnormal findings: Secondary | ICD-10-CM | POA: Diagnosis not present

## 2017-06-30 DIAGNOSIS — E1142 Type 2 diabetes mellitus with diabetic polyneuropathy: Secondary | ICD-10-CM | POA: Diagnosis not present

## 2017-06-30 DIAGNOSIS — E11621 Type 2 diabetes mellitus with foot ulcer: Secondary | ICD-10-CM | POA: Diagnosis not present

## 2017-06-30 DIAGNOSIS — M86272 Subacute osteomyelitis, left ankle and foot: Secondary | ICD-10-CM

## 2017-06-30 DIAGNOSIS — I10 Essential (primary) hypertension: Secondary | ICD-10-CM | POA: Diagnosis not present

## 2017-06-30 DIAGNOSIS — E1165 Type 2 diabetes mellitus with hyperglycemia: Secondary | ICD-10-CM | POA: Diagnosis not present

## 2017-06-30 DIAGNOSIS — E669 Obesity, unspecified: Secondary | ICD-10-CM | POA: Diagnosis not present

## 2017-06-30 DIAGNOSIS — L97529 Non-pressure chronic ulcer of other part of left foot with unspecified severity: Secondary | ICD-10-CM | POA: Diagnosis not present

## 2017-06-30 DIAGNOSIS — Z23 Encounter for immunization: Secondary | ICD-10-CM | POA: Diagnosis not present

## 2017-06-30 DIAGNOSIS — Z7189 Other specified counseling: Secondary | ICD-10-CM | POA: Diagnosis not present

## 2017-06-30 DIAGNOSIS — E7800 Pure hypercholesterolemia, unspecified: Secondary | ICD-10-CM | POA: Diagnosis not present

## 2017-06-30 DIAGNOSIS — G4733 Obstructive sleep apnea (adult) (pediatric): Secondary | ICD-10-CM | POA: Diagnosis not present

## 2017-06-30 DIAGNOSIS — E113219 Type 2 diabetes mellitus with mild nonproliferative diabetic retinopathy with macular edema, unspecified eye: Secondary | ICD-10-CM | POA: Diagnosis not present

## 2017-06-30 DIAGNOSIS — Z1389 Encounter for screening for other disorder: Secondary | ICD-10-CM | POA: Diagnosis not present

## 2017-06-30 NOTE — Progress Notes (Signed)
Office Visit Note   Patient: Martin Gordon           Date of Birth: 17-Feb-1946           MRN: [id number] Visit Date: 06/30/2017              Requested by: Lavone Orn, MD [address] [place], [place] [address] PCP: Lavone Orn, MD  Chief Complaint  Patient presents with  . Left Great Toe - Follow-up      HPI: Patient is a 71 year old gentleman who presents in follow-up for Wagner grade 3 ulcer left great toe with chronic osteomyelitis of the tuft of the left great toe. Patient denies any pain swelling or drainage.  Assessment & Plan: Visit Diagnoses:  1. Diabetic ulcer of left great toe ([place])   2. Subacute osteomyelitis of left foot ([place])     Plan: ulcer was debrided of skin and soft tissue he tolerated this well plan follow-up in 3 months. Discussed that he develops any acute redness pain swelling or drainage to follow-up immediately.  Follow-Up Instructions: Return in about 3 months (around 09/30/2017).   Ortho Exam  Patient is alert, oriented, no adenopathy, well-dressed, normal affect, normal respiratory effort. Examination patient has a palpable pulse in his foot is warm there are no atrophic changes. He does have a ulcer beneath the left great toe tuft. There is swelling of the toe but no cellulitis no drainage no odor. After informed consent a 10 blade knife was used to debride the skin and soft tissue back to healthy viable tissue the ulcer is 10 mm in diameter and 3 mm deep.  Imaging: No results found. No images are attached to the encounter.  Labs: No results found for: HGBA1C, ESRSEDRATE, CRP, LABURIC, REPTSTATUS, GRAMSTAIN, CULT, LABORGA  Orders:  No orders of the defined types were placed in this encounter.  No orders of the defined types were placed in this encounter.    Procedures: No procedures performed  Clinical Data: No additional findings.  ROS:  All other systems negative, except as noted in the HPI. Review of  Systems  Objective: Vital Signs: There were no vitals taken for this visit.  Specialty Comments:  No specialty comments available.  PMFS History: Patient Active Problem List   Diagnosis Date Noted  . Diabetic ulcer of left great toe ([place]) 02/21/2017  . Subacute osteomyelitis of left foot ([place]) 02/21/2017  . Erectile dysfunction 07/04/2014   Past Medical History:  Diagnosis Date  . Diabetes mellitus without complication ([place])    oral med and insulin use  . Hyperlipidemia   . Hypertension     History reviewed. No pertinent family history.  Past Surgical History:  Procedure Laterality Date  . COLONOSCOPY WITH PROPOFOL N/A 09/24/2016   Procedure: COLONOSCOPY WITH PROPOFOL;  Surgeon: Garlan Fair, MD;  [facility];  Service: Endoscopy;  Laterality: N/A;  . FLEXIBLE SIGMOIDOSCOPY N/A 06/17/2016   Procedure: FLEXIBLE SIGMOIDOSCOPY;  Surgeon: Garlan Fair, MD;  [facility];  Service: Endoscopy;  Laterality: N/A;  Scope advanced to Sigmoid colon, Full colonoscopy aborted due to unsatisfactory prep.  Marland Kitchen HERNIA REPAIR     hx of   . PENILE PROSTHESIS IMPLANT N/A 07/04/2014   Procedure: PENILE PROTHESIS INFLATABLE, Three piece;  Surgeon: Claybon Jabs, MD;  Location: [facility];  Service: Urology;  Laterality: N/A;   Social History   Occupational History  . Not on file.   Social History Main  Topics  . Smoking status: Former Smoker    Packs/day: 2.00    Years: 30.00    Types: Cigarettes    Quit date: 09/16/1994  . Smokeless tobacco: Never Used  . Alcohol use No     Comment: past hx stopped in 1993  . Drug use: No  . Sexual activity: Not on file

## 2017-07-22 DIAGNOSIS — E1142 Type 2 diabetes mellitus with diabetic polyneuropathy: Secondary | ICD-10-CM | POA: Diagnosis not present

## 2017-07-22 DIAGNOSIS — Z794 Long term (current) use of insulin: Secondary | ICD-10-CM | POA: Diagnosis not present

## 2017-07-22 DIAGNOSIS — I10 Essential (primary) hypertension: Secondary | ICD-10-CM | POA: Diagnosis not present

## 2017-08-11 DIAGNOSIS — I10 Essential (primary) hypertension: Secondary | ICD-10-CM | POA: Diagnosis not present

## 2017-08-11 DIAGNOSIS — E1142 Type 2 diabetes mellitus with diabetic polyneuropathy: Secondary | ICD-10-CM | POA: Diagnosis not present

## 2017-08-11 DIAGNOSIS — C61 Malignant neoplasm of prostate: Secondary | ICD-10-CM | POA: Diagnosis not present

## 2017-08-11 DIAGNOSIS — E113211 Type 2 diabetes mellitus with mild nonproliferative diabetic retinopathy with macular edema, right eye: Secondary | ICD-10-CM | POA: Diagnosis not present

## 2017-08-11 DIAGNOSIS — E11621 Type 2 diabetes mellitus with foot ulcer: Secondary | ICD-10-CM | POA: Diagnosis not present

## 2017-08-11 DIAGNOSIS — E1165 Type 2 diabetes mellitus with hyperglycemia: Secondary | ICD-10-CM | POA: Diagnosis not present

## 2017-08-11 DIAGNOSIS — Z794 Long term (current) use of insulin: Secondary | ICD-10-CM | POA: Diagnosis not present

## 2017-10-02 ENCOUNTER — Encounter (INDEPENDENT_AMBULATORY_CARE_PROVIDER_SITE_OTHER): Payer: Self-pay | Admitting: Orthopedic Surgery

## 2017-10-02 ENCOUNTER — Ambulatory Visit (INDEPENDENT_AMBULATORY_CARE_PROVIDER_SITE_OTHER): Payer: Medicare HMO | Admitting: Orthopedic Surgery

## 2017-10-02 VITALS — Ht 76.0 in | Wt 270.0 lb

## 2017-10-02 DIAGNOSIS — I87323 Chronic venous hypertension (idiopathic) with inflammation of bilateral lower extremity: Secondary | ICD-10-CM

## 2017-10-02 DIAGNOSIS — E11621 Type 2 diabetes mellitus with foot ulcer: Secondary | ICD-10-CM | POA: Diagnosis not present

## 2017-10-02 DIAGNOSIS — L97529 Non-pressure chronic ulcer of other part of left foot with unspecified severity: Secondary | ICD-10-CM

## 2017-10-02 NOTE — Progress Notes (Signed)
Office Visit Note   Patient: Martin Gordon           Date of Birth: 1945-12-18           MRN: [id number] Visit Date: 10/02/2017              Requested by: Lavone Orn, MD [address] [place], [place] [address] PCP: Lavone Orn, MD  Chief Complaint  Patient presents with  . Left Foot - Follow-up    GT ulcer      HPI: Patient is a 72 year old gentleman is seen in follow-up for left great toe with a Waggoner grade 1 ulcer with history of osteomyelitis of the great toe with venous stasis insufficiency bilateral lower extremities.  Patient states he had to cut the top of his socks due to the swelling in the calf.  Assessment & Plan: Visit Diagnoses:  1. Idiopathic chronic venous hypertension of both lower extremities with inflammation   2. Diabetic ulcer of left great toe ([place])     Plan: Recommended that he obtain a 15-20 mm compression stocking at [facility].  Recommended follow-up in 2 months to reevaluate the left great toe.  Follow-Up Instructions: Return in about 2 months (around 11/30/2017).   Ortho Exam  Patient is alert, oriented, no adenopathy, well-dressed, normal affect, normal respiratory effort. Examination patient has a good pulse he does not have protective sensation.  He does have swelling of the great toe consistent with previous infection of the tuft of the great toe.  He has a Waggoner grade 1 ulcer over the plantar aspect of the great toe.  After informed consent a 10 blade knife was used to debride the skin and soft tissue back to healthy viable tissue.  There is no purulence no drainage no cellulitis no odor.  The ulcer is 15 mm in diameter and 3 mm deep after debridement.  Imaging: No results found. No images are attached to the encounter.  Labs: No results found for: HGBA1C, ESRSEDRATE, CRP, LABURIC, REPTSTATUS, GRAMSTAIN, CULT, LABORGA  @LABSALLVALUES (HGBA1)@  Body mass index is 32.87 kg/m.  Orders:  No orders of  the defined types were placed in this encounter.  No orders of the defined types were placed in this encounter.    Procedures: No procedures performed  Clinical Data: No additional findings.  ROS:  All other systems negative, except as noted in the HPI. Review of Systems  Objective: Vital Signs: Ht 6\' 4"  (1.93 m)   Wt 270 lb (122.5 kg)   BMI 32.87 kg/m   Specialty Comments:  No specialty comments available.  PMFS History: Patient Active Problem List   Diagnosis Date Noted  . Diabetic ulcer of left great toe ([place]) 02/21/2017  . Subacute osteomyelitis of left foot ([place]) 02/21/2017  . Erectile dysfunction 07/04/2014   Past Medical History:  Diagnosis Date  . Diabetes mellitus without complication ([place])    oral med and insulin use  . Hyperlipidemia   . Hypertension     History reviewed. No pertinent family history.  Past Surgical History:  Procedure Laterality Date  . COLONOSCOPY WITH PROPOFOL N/A 09/24/2016   Procedure: COLONOSCOPY WITH PROPOFOL;  Surgeon: Garlan Fair, MD;  [facility];  Service: Endoscopy;  Laterality: N/A;  . FLEXIBLE SIGMOIDOSCOPY N/A 06/17/2016   Procedure: FLEXIBLE SIGMOIDOSCOPY;  Surgeon: Garlan Fair, MD;  [facility];  Service: Endoscopy;  Laterality: N/A;  Scope advanced to Sigmoid colon, Full colonoscopy aborted due to unsatisfactory prep.  Marland Kitchen  HERNIA REPAIR     hx of   . PENILE PROSTHESIS IMPLANT N/A 07/04/2014   Procedure: PENILE PROTHESIS INFLATABLE, Three piece;  Surgeon: Claybon Jabs, MD;  Location: [facility];  Service: Urology;  Laterality: N/A;   Social History   Occupational History  . Not on file  Tobacco Use  . Smoking status: Former Smoker    Packs/day: 2.00    Years: 30.00    Pack years: 60.00    Types: Cigarettes    Last attempt to quit: 09/16/1994    Years since quitting: 23.0  . Smokeless tobacco: Never Used  Substance and Sexual Activity  . Alcohol use: No    Comment:  past hx stopped in 1993  . Drug use: No  . Sexual activity: Not on file

## 2017-10-08 DIAGNOSIS — Z0100 Encounter for examination of eyes and vision without abnormal findings: Secondary | ICD-10-CM | POA: Diagnosis not present

## 2017-10-08 DIAGNOSIS — H524 Presbyopia: Secondary | ICD-10-CM | POA: Diagnosis not present

## 2017-10-22 DIAGNOSIS — E1142 Type 2 diabetes mellitus with diabetic polyneuropathy: Secondary | ICD-10-CM | POA: Diagnosis not present

## 2017-10-22 DIAGNOSIS — I10 Essential (primary) hypertension: Secondary | ICD-10-CM | POA: Diagnosis not present

## 2017-10-22 DIAGNOSIS — E11621 Type 2 diabetes mellitus with foot ulcer: Secondary | ICD-10-CM | POA: Diagnosis not present

## 2017-10-22 DIAGNOSIS — E113219 Type 2 diabetes mellitus with mild nonproliferative diabetic retinopathy with macular edema, unspecified eye: Secondary | ICD-10-CM | POA: Diagnosis not present

## 2017-10-23 DIAGNOSIS — Z794 Long term (current) use of insulin: Secondary | ICD-10-CM | POA: Diagnosis not present

## 2017-10-23 DIAGNOSIS — E11621 Type 2 diabetes mellitus with foot ulcer: Secondary | ICD-10-CM | POA: Diagnosis not present

## 2017-10-23 DIAGNOSIS — E1165 Type 2 diabetes mellitus with hyperglycemia: Secondary | ICD-10-CM | POA: Diagnosis not present

## 2017-10-23 DIAGNOSIS — C61 Malignant neoplasm of prostate: Secondary | ICD-10-CM | POA: Diagnosis not present

## 2017-10-23 DIAGNOSIS — I10 Essential (primary) hypertension: Secondary | ICD-10-CM | POA: Diagnosis not present

## 2017-10-23 DIAGNOSIS — E1142 Type 2 diabetes mellitus with diabetic polyneuropathy: Secondary | ICD-10-CM | POA: Diagnosis not present

## 2017-10-23 DIAGNOSIS — E113219 Type 2 diabetes mellitus with mild nonproliferative diabetic retinopathy with macular edema, unspecified eye: Secondary | ICD-10-CM | POA: Diagnosis not present

## 2017-10-27 ENCOUNTER — Other Ambulatory Visit: Payer: Self-pay | Admitting: *Deleted

## 2017-10-27 NOTE — Patient Outreach (Signed)
[facility]) Care Management  10/27/2017  Martin Gordon June 22, 1946 [id number]  Referral via MD office: Reason: "can;t afford insulin; please assist."  Telephone call to patient who was advised of reason for call & of [facility] care Management services.  [place] verification received from patient.  Subjective/objective: Patient agreed to intake assessment. States he does have high blood pressure & diabetes.  Voices that he takes own blood pressure with last reading 140/81. States she checks blood sugar 3 times weekly. States he does not know what most recent "A1C" level is because has not been called results. States goal is below 7.  Voices he has never attended diabetes classes.  States he dose have some numbness of both feet. States he is independent & cares for self. Drives self to MD appointments.  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    States he currently is taking insulin (lantus) once daily  and metformin for diabetes. Voices he was taking Novolog or Humalog insulin twice daily but has not taken in 1 month because he cannot afford. States she was getting from MD office but there were no more samples at his last appointment (10/22/2017). Patient voices that he uses [employer] mail order of [place] to get medications.   Patient states he would be interested in services of [facility] Pharmacist and [place].  States did not need anyone to see him. Prefers  in community   Assessment : Patient states major concern is high coast of short-acting insulin & inability to obtain.   Patient wants services of [place].  Plan: Send to care management to assign to Pharmacist for medication assistance; assign health Coach for disease management for DM.  Martin Daisy, RN BSN [place] Management Coordinator [facility] Care Management  [phone]

## 2017-10-28 ENCOUNTER — Encounter: Payer: Self-pay | Admitting: *Deleted

## 2017-10-29 ENCOUNTER — Other Ambulatory Visit: Payer: Self-pay | Admitting: Pharmacist

## 2017-10-30 NOTE — Patient Outreach (Signed)
Martin Gordon) Care Management  [place]   10/30/2017  Martin Gordon 04-12-1946 [id number]  72 year old male referred to [place] Management.  [place] services requested for medication assistance with insulin.  PMHx includes, but not limited to, bilateral lower extremity venous stasis, diabetes mellitus type 2 with history of toe ulcers and osteomyelitis, HTN, and HLD.  Per notes, patient has not had short-acting insulin in at least 1 month due to cost.    Subjective: Successful telephone encounter with Martin Gordon today.  HIPAA identifiers verified. Martin Gordon is agreeable to [facility] pharmacy services.  He reports he was taking samples of Novolog or Humalog from Martin Gordon office however the office did not have supply of samples at last visit and he was told that the co-pay through [facility] would be >$600 therefore he has not been able to afford it.    Objective:  Hemoglobin A1C = 8.2 (10/22/2017) SCr = 1.1 (06/30/2017), CrCl ~100 ml/min  Encounter Medications: Outpatient Encounter Medications as of 10/29/2017  Medication Sig Note  . amlodipine-atorvastatin (CADUET) 10-20 MG tablet    . aspirin EC 81 MG tablet Take 81 mg by mouth every morning.   . insulin glargine (LANTUS) 100 UNIT/ML injection Inject 45 Units into the skin at bedtime.  09/24/2016: Pt. Took 22 units last night  . insulin lispro (HUMALOG) 100 UNIT/ML injection Inject 14 Units into the skin 2 (two) times daily.    Marland Kitchen lisinopril (PRINIVIL,ZESTRIL) 20 MG tablet Take 20 mg by mouth every morning.   . metFORMIN (GLUCOPHAGE) 1000 MG tablet Take 1,000 mg by mouth 2 (two) times daily with a meal.   . Multiple Vitamin (MULTIVITAMIN WITH MINERALS) TABS tablet Take 1 tablet by mouth every morning.   . Omega-3 Fatty Acids (FISH OIL PO) Take 1 tablet by mouth daily.    . [DISCONTINUED] mupirocin ointment (BACTROBAN) 2 % Apply 1 application topically daily.    No facility-administered encounter medications on file as  of 10/29/2017.     Functional Status: No flowsheet data found.  Fall/Depression Screening: Fall Risk  10/27/2017 01/30/2016  Falls in the past year? No No   PHQ 2/9 Scores 10/27/2017 01/30/2016  PHQ - 2 Score 0 0    Assessment: Medication reconciliation performed.   Drugs sorted by system:  Neurologic/Psychologic: none  Cardiovascular: Amlodipine-atorvastatin, aspirin 81mg , lisinopril, omega-3 fatty acids  Pulmonary/Allergy: none  Gastrointestinal: none  Endocrine: insulin glargine, metformin, humalog  Renal: none  Topical: none  Pain: none  Vitamins/Minerals: MVI  Infectious Diseases: none  Miscellaneous: none  Duplications in therapy:  Gaps in therapy: No issues Medications to avoid in the elderly: No issues Drug interactions: No issues Other issues noted:  Medication assistance:  Patient unable to afford Humalog due to high cost.  He is not eligible for Extra Help based on income $3000 / month.    Per [facility] formulary, Novolog is preferred short-acting insulin and is Tier 3. Martin Gordon pharmacy representative confirms Novolog test claim through mail order = $125 / 90 day supply, local preferred pharmacy = $135 / 90 day supply.  Per Martin Gordon, patient paid $135 / 3 boxes of Lantus, dispensed as 67 day supply of Lantus.  This does not match current dose of Lantus as 45 units/day would require 4.5 boxes.  If rounded down to 4 boxes, this would provide 80 day supply and save patient money.    Reviewed Martin Gordon OTC catalogue with patient and provided phone number for patient to  contact for orders.  Patient expressed understanding as he could order his MVI, aspirin, and Fish Oil through this program as well as compression stockings that he has not bought yet due to cost.   Plan: Message left with Martin Gordon office regarding need for new prescription for Novolog substitution rather than Humalog.  Also will inquire about updated prescription for Lantus with current  dosage.   I will follow-up with patient once I confirm new prescription has been sent to pharmacy by Martin Gordon office.   Ralene Bathe, PharmD, [place] [phone]

## 2017-10-31 ENCOUNTER — Other Ambulatory Visit: Payer: Self-pay | Admitting: Pharmacist

## 2017-10-31 ENCOUNTER — Ambulatory Visit: Payer: Self-pay | Admitting: Pharmacist

## 2017-10-31 NOTE — Patient Outreach (Signed)
[facility]) Care Management  10/31/2017  Martin Gordon 24-Nov-1945 [id number]  Care coordination call placed to patient's pharmacy, [facility].  New insulin prescriptions received for Lantus and Novolog however prescriptions do not specify correct number of insulin pen boxes to match 90 day supply quantity.   Successful telephone call placed to Martin Gordon.  HIPAA identifiers verified. I updated Martin Gordon on insulin prescriptions and recommended that he wait to pick up Novolog until Dr. Delene Ruffini office resends correct insulin instructions.    Care coordination call placed to Dr. Delene Ruffini office.  RN will re-send prescriptions to pharmacy.    Plan: I will follow-up with pharmacy and Martin Gordon on Monday.   Ralene Bathe, PharmD, [place] [phone]

## 2017-11-03 ENCOUNTER — Other Ambulatory Visit: Payer: Self-pay | Admitting: *Deleted

## 2017-11-03 ENCOUNTER — Ambulatory Visit: Payer: Self-pay | Admitting: Pharmacist

## 2017-11-03 ENCOUNTER — Other Ambulatory Visit: Payer: Self-pay | Admitting: Pharmacist

## 2017-11-03 NOTE — Patient Outreach (Signed)
[facility]) Care Management  11/03/2017  Brennon Otterness 1946/06/12 [id number]  Call placed to [place].  Pharmacist confirmed new prescriptions for Novolog and Lantus received with correct instructions and quantity.  Novolog ready for pick-up.  Previous prescriptions deactivated.   Unsuccessful telephone call to Mr. Luginbill today.  I left a HIPPA compliant voicemail on the home and mobile phone.    Plan: I will follow-up with Mr. Mucha tomorrow regarding insulin.   Ralene Bathe, PharmD, [place] [phone]

## 2017-11-03 NOTE — Patient Outreach (Signed)
[facility]) Care Management  11/03/2017  Martin Gordon 07/30/1946 [id number]   RN Health Coach attempted  #1 follow up outreach call to patient.  Patient was unavailable. HIPPA compliance voicemail message left with return callback number.  Plan: RN will call patient again within 10  days.  [place] Care Management [phone]

## 2017-11-04 ENCOUNTER — Other Ambulatory Visit: Payer: Self-pay | Admitting: Pharmacist

## 2017-11-04 ENCOUNTER — Encounter: Payer: Self-pay | Admitting: Sports Medicine

## 2017-11-04 ENCOUNTER — Ambulatory Visit: Payer: Self-pay | Admitting: Pharmacist

## 2017-11-04 ENCOUNTER — Ambulatory Visit: Payer: Medicare HMO | Admitting: Sports Medicine

## 2017-11-04 DIAGNOSIS — E1142 Type 2 diabetes mellitus with diabetic polyneuropathy: Secondary | ICD-10-CM

## 2017-11-04 DIAGNOSIS — M79674 Pain in right toe(s): Secondary | ICD-10-CM

## 2017-11-04 DIAGNOSIS — B351 Tinea unguium: Secondary | ICD-10-CM

## 2017-11-04 DIAGNOSIS — M79675 Pain in left toe(s): Secondary | ICD-10-CM | POA: Diagnosis not present

## 2017-11-04 NOTE — Patient Outreach (Addendum)
[facility]) Care Management  11/04/2017  Martin Gordon [birth year] [id number]  Unsuccessful telephone call #2 to patient today.  I left a HIPPA compliant voicemail on the home and mobile phone.    Plan: I will mail Mr. Buresh a letter describing [facility] services and will follow-up with him in 10 business days regarding insulin unless I hear back from him beforehand.   Ralene Bathe, PharmD, [place] [phone]   Update: Incoming call received from Martin Gordon. HIPAA identifiers verified. I relayed message to Martin Gordon that his Novolog insulin in ready at [facility] for a 90 day supply for $135.  Martin Gordon expressed understanding and reports he can afford this.  He also reports that he has not started using the [facility] OTC program yet but he has the phone number to call / order medications and supplies.  Martin Gordon denies any other medication needs or concerns at this time. I provided my phone number in case he needs to reach out to me in the future.    Plan: [place] will sign-off patient case at this time.  I am happy to assist in the future as needed.   I will alert [place] of pharmacy case closure.    Ralene Bathe, PharmD, [place] [phone]

## 2017-11-04 NOTE — Patient Instructions (Signed)

## 2017-11-04 NOTE — Progress Notes (Signed)
Subjective: Martin Gordon is a 72 y.o. male patient with history of diabetes who presents to office today complaining of long,mildly painful nails while ambulating in shoes; unable to trim. Patient states that the glucose reading this morning was not recorded. Patient denies any new changes in medication or new problems. Patient admits some numbness, burning or tingling in the legs.  Review of Systems  All other systems reviewed and are negative.  Patient Active Problem List   Diagnosis Date Noted  . Diabetic ulcer of left great toe ([place]) 02/21/2017  . Subacute osteomyelitis of left foot ([place]) 02/21/2017  . Erectile dysfunction 07/04/2014   Current Outpatient Medications on File Prior to Visit  Medication Sig Dispense Refill  . amlodipine-atorvastatin (CADUET) 10-20 MG tablet     . aspirin EC 81 MG tablet Take 81 mg by mouth every morning.    . insulin glargine (LANTUS) 100 UNIT/ML injection Inject 45 Units into the skin at bedtime.     . insulin lispro (HUMALOG) 100 UNIT/ML injection Inject 14 Units into the skin 2 (two) times daily.     Marland Kitchen lisinopril (PRINIVIL,ZESTRIL) 20 MG tablet Take 20 mg by mouth every morning.    . metFORMIN (GLUCOPHAGE) 1000 MG tablet Take 1,000 mg by mouth 2 (two) times daily with a meal.    . Multiple Vitamin (MULTIVITAMIN WITH MINERALS) TABS tablet Take 1 tablet by mouth every morning.    . Omega-3 Fatty Acids (FISH OIL PO) Take 1 tablet by mouth daily.      No current facility-administered medications on file prior to visit.    No Known Allergies  No results found for this or any previous visit (from the past 2160 hour(s)).  Objective: General: Patient is awake, alert, and oriented x 3 and in no acute distress.  Integument: Skin is warm, dry and supple bilateral. Nails are tender, long, thickened and  dystrophic with subungual debris, consistent with onychomycosis, 1-5 bilateral. No signs of infection. No open lesions or preulcerative lesions present  bilateral. History of previous toe ulcer. Remaining integument unremarkable.  Vasculature:  Dorsalis Pedis pulse 1/4 bilateral. Posterior Tibial pulse  1/4 bilateral.  Capillary fill time <3 sec 1-5 bilateral. Positive hair growth to the level of the digits. Temperature gradient within normal limits. No varicosities present bilateral. No edema present bilateral.   Neurology: The patient has diminshed sensation measured with a 5.07/10g Semmes Weinstein Monofilament at all pedal sites bilateral . Vibratory sensation diminished bilateral with tuning fork. No Babinski sign present bilateral.   Musculoskeletal: Asymptomatic arthritis and pes planus pedal deformities noted bilateral. Muscular strength 5/5 in all lower extremity muscular groups bilateral without pain on range of motion . No tenderness with calf compression bilateral.  Assessment and Plan: Problem List Items Addressed This Visit    None    Visit Diagnoses    Pain due to onychomycosis of toenails of both feet    -  Primary   Diabetic polyneuropathy associated with type 2 diabetes mellitus ([place])          -Examined patient. -Discussed and educated patient on diabetic foot care, especially with  regards to the vascular, neurological and musculoskeletal systems.  -Stressed the importance of good glycemic control and the detriment of not  controlling glucose levels in relation to the foot. -Mechanically debrided all nails 1-5 bilateral using sterile nail nipper and filed with dremel without incident  -Answered all patient questions -Patient to return  in 3 months for at risk foot care -Patient  advised to call the office if any problems or questions arise in the meantime.  Landis Martins, DPM

## 2017-11-04 NOTE — Progress Notes (Signed)
   Subjective:    Patient ID: Martin Gordon, male    DOB: 20-Feb-1946, 72 y.o.   MRN: [id number]  HPI    Review of Systems  All other systems reviewed and are negative.      Objective:   Physical Exam        Assessment & Plan:

## 2017-11-05 ENCOUNTER — Ambulatory Visit: Payer: Self-pay | Admitting: *Deleted

## 2017-11-24 ENCOUNTER — Other Ambulatory Visit: Payer: Self-pay | Admitting: *Deleted

## 2017-11-24 NOTE — Patient Outreach (Signed)
[facility]) Care Management  11/24/2017  Martin Gordon Aug 19, 1946 [id number]  RN Health Coach attempted #2  follow up outreach call to patient.  Patient was unavailable. No voicemail message pick up. Plan: RN will send unsuccessful outreach letter RN will call patient again within 10 business days  [place] Management [phone]

## 2017-11-25 DIAGNOSIS — R269 Unspecified abnormalities of gait and mobility: Secondary | ICD-10-CM | POA: Diagnosis not present

## 2017-11-26 ENCOUNTER — Other Ambulatory Visit: Payer: Self-pay | Admitting: Internal Medicine

## 2017-11-26 DIAGNOSIS — R269 Unspecified abnormalities of gait and mobility: Secondary | ICD-10-CM

## 2017-12-01 ENCOUNTER — Encounter (INDEPENDENT_AMBULATORY_CARE_PROVIDER_SITE_OTHER): Payer: Self-pay | Admitting: Orthopedic Surgery

## 2017-12-01 ENCOUNTER — Ambulatory Visit (INDEPENDENT_AMBULATORY_CARE_PROVIDER_SITE_OTHER): Payer: Medicare HMO | Admitting: Orthopedic Surgery

## 2017-12-01 VITALS — Ht 76.0 in | Wt 270.0 lb

## 2017-12-01 DIAGNOSIS — I87323 Chronic venous hypertension (idiopathic) with inflammation of bilateral lower extremity: Secondary | ICD-10-CM

## 2017-12-01 DIAGNOSIS — E11621 Type 2 diabetes mellitus with foot ulcer: Secondary | ICD-10-CM | POA: Diagnosis not present

## 2017-12-01 DIAGNOSIS — L97529 Non-pressure chronic ulcer of other part of left foot with unspecified severity: Secondary | ICD-10-CM | POA: Diagnosis not present

## 2017-12-01 NOTE — Progress Notes (Signed)
Office Visit Note   Patient: Martin Gordon           Date of Birth: 23-Jul-1946           MRN: [id number] Visit Date: 12/01/2017              Requested by: Lavone Orn, MD [address] [place], [place] [address] PCP: Lavone Orn, MD  Chief Complaint  Patient presents with  . Left Foot - Follow-up      HPI: Patient presents in follow-up for Waggoner grade 1 ulcer left great toe as well as venous insufficiency with symptomatic venous stasis ulcers.  Assessment & Plan: Visit Diagnoses:  1. Diabetic ulcer of left great toe ([place])   2. Idiopathic chronic venous hypertension of both lower extremities with inflammation     Plan: Patient was recommended again to obtain a pair of 15-20 mm compression stockings knee-high.  Follow-Up Instructions: Return in about 3 months (around 03/03/2018).   Ortho Exam  Patient is alert, oriented, no adenopathy, well-dressed, normal affect, normal respiratory effort. Examination patient has a slight antalgic gait he has a Waggoner grade 1 ulcer over the left great toe.  After informed consent a 10 blade knife was used to debride the skin and soft tissue back to healthy viable tissue there is no exposed bone tendon there is no sausage digit swelling there is no drainage no cellulitis.  The ulcer is 10 mm in diameter 1 mm deep.  Patient has a good pulse he has pitting edema up to the tibial tubercle with brawny skin color changes he has a small traumatic venous ulcer about 5 mm in diameter with good granulation tissue no cellulitis no signs of infection.  Imaging: No results found. No images are attached to the encounter.  Labs: No results found for: HGBA1C, ESRSEDRATE, CRP, LABURIC, REPTSTATUS, GRAMSTAIN, CULT, LABORGA  @LABSALLVALUES (HGBA1)@  Body mass index is 32.87 kg/m.  Orders:  No orders of the defined types were placed in this encounter.  No orders of the defined types were placed in this encounter.    Procedures: No  procedures performed  Clinical Data: No additional findings.  ROS:  All other systems negative, except as noted in the HPI. Review of Systems  Objective: Vital Signs: Ht 6\' 4"  (1.93 m)   Wt 270 lb (122.5 kg)   BMI 32.87 kg/m   Specialty Comments:  No specialty comments available.  PMFS History: Patient Active Problem List   Diagnosis Date Noted  . Diabetic ulcer of left great toe ([place]) 02/21/2017  . Subacute osteomyelitis of left foot ([place]) 02/21/2017  . Erectile dysfunction 07/04/2014   Past Medical History:  Diagnosis Date  . Diabetes mellitus without complication ([place])    oral med and insulin use  . Hyperlipidemia   . Hypertension     History reviewed. No pertinent family history.  Past Surgical History:  Procedure Laterality Date  . COLONOSCOPY WITH PROPOFOL N/A 09/24/2016   Procedure: COLONOSCOPY WITH PROPOFOL;  Surgeon: Garlan Fair, MD;  Location: [facility];  Service: Endoscopy;  Laterality: N/A;  . FLEXIBLE SIGMOIDOSCOPY N/A 06/17/2016   Procedure: FLEXIBLE SIGMOIDOSCOPY;  Surgeon: Garlan Fair, MD;  Location: [facility];  Service: Endoscopy;  Laterality: N/A;  Scope advanced to Sigmoid colon, Full colonoscopy aborted due to unsatisfactory prep.  Marland Kitchen HERNIA REPAIR     hx of   . PENILE PROSTHESIS IMPLANT N/A 07/04/2014   Procedure: PENILE PROTHESIS INFLATABLE, Three piece;  Surgeon: Thana Farr  Karsten Ro, MD;  Location: [facility];  Service: Urology;  Laterality: N/A;   Social History   Occupational History  . Not on file  Tobacco Use  . Smoking status: Former Smoker    Packs/day: 2.00    Years: 30.00    Pack years: 60.00    Types: Cigarettes    Last attempt to quit: 09/16/1994    Years since quitting: 23.2  . Smokeless tobacco: Never Used  Substance and Sexual Activity  . Alcohol use: No    Comment: past hx stopped in 1993  . Drug use: No  . Sexual activity: Not on file

## 2017-12-08 ENCOUNTER — Other Ambulatory Visit: Payer: Self-pay | Admitting: *Deleted

## 2017-12-08 NOTE — Patient Outreach (Signed)
[facility]) Care Management  12/08/2017  Martin Gordon Oct 03, 1945 [id number]  RN Health Coach attempted #3 follow up outreach call to patient.  Patient was unavailable. No voicemail pick up. Plan:  RN will close patient case  Martin Gordon Management [phone]

## 2017-12-10 ENCOUNTER — Ambulatory Visit
Admission: RE | Admit: 2017-12-10 | Discharge: 2017-12-10 | Disposition: A | Discharge status: Home / Self Care | Payer: Medicare HMO | Source: Ambulatory Visit | Attending: Internal Medicine | Admitting: Internal Medicine

## 2017-12-10 DIAGNOSIS — R269 Unspecified abnormalities of gait and mobility: Secondary | ICD-10-CM

## 2017-12-10 MED ORDER — GADOBENATE DIMEGLUMINE 529 MG/ML IV SOLN
20.0000 mL | Freq: Once | INTRAVENOUS | Status: AC | PRN
  Administered 2017-12-10: 20 mL via INTRAVENOUS

## 2017-12-12 DIAGNOSIS — E1165 Type 2 diabetes mellitus with hyperglycemia: Secondary | ICD-10-CM | POA: Diagnosis not present

## 2017-12-12 DIAGNOSIS — E113219 Type 2 diabetes mellitus with mild nonproliferative diabetic retinopathy with macular edema, unspecified eye: Secondary | ICD-10-CM | POA: Diagnosis not present

## 2017-12-12 DIAGNOSIS — Z794 Long term (current) use of insulin: Secondary | ICD-10-CM | POA: Diagnosis not present

## 2017-12-12 DIAGNOSIS — E1142 Type 2 diabetes mellitus with diabetic polyneuropathy: Secondary | ICD-10-CM | POA: Diagnosis not present

## 2017-12-12 DIAGNOSIS — E11621 Type 2 diabetes mellitus with foot ulcer: Secondary | ICD-10-CM | POA: Diagnosis not present

## 2017-12-12 DIAGNOSIS — C61 Malignant neoplasm of prostate: Secondary | ICD-10-CM | POA: Diagnosis not present

## 2017-12-12 DIAGNOSIS — I10 Essential (primary) hypertension: Secondary | ICD-10-CM | POA: Diagnosis not present

## 2017-12-31 DIAGNOSIS — C61 Malignant neoplasm of prostate: Secondary | ICD-10-CM | POA: Diagnosis not present

## 2017-12-31 DIAGNOSIS — E11621 Type 2 diabetes mellitus with foot ulcer: Secondary | ICD-10-CM | POA: Diagnosis not present

## 2017-12-31 DIAGNOSIS — E1142 Type 2 diabetes mellitus with diabetic polyneuropathy: Secondary | ICD-10-CM | POA: Diagnosis not present

## 2017-12-31 DIAGNOSIS — E113219 Type 2 diabetes mellitus with mild nonproliferative diabetic retinopathy with macular edema, unspecified eye: Secondary | ICD-10-CM | POA: Diagnosis not present

## 2017-12-31 DIAGNOSIS — Z794 Long term (current) use of insulin: Secondary | ICD-10-CM | POA: Diagnosis not present

## 2017-12-31 DIAGNOSIS — I10 Essential (primary) hypertension: Secondary | ICD-10-CM | POA: Diagnosis not present

## 2018-02-03 ENCOUNTER — Ambulatory Visit: Payer: Medicare HMO | Admitting: Sports Medicine

## 2018-02-12 DIAGNOSIS — I10 Essential (primary) hypertension: Secondary | ICD-10-CM | POA: Diagnosis not present

## 2018-02-12 DIAGNOSIS — Z794 Long term (current) use of insulin: Secondary | ICD-10-CM | POA: Diagnosis not present

## 2018-02-12 DIAGNOSIS — C61 Malignant neoplasm of prostate: Secondary | ICD-10-CM | POA: Diagnosis not present

## 2018-02-12 DIAGNOSIS — E1165 Type 2 diabetes mellitus with hyperglycemia: Secondary | ICD-10-CM | POA: Diagnosis not present

## 2018-02-12 DIAGNOSIS — E1142 Type 2 diabetes mellitus with diabetic polyneuropathy: Secondary | ICD-10-CM | POA: Diagnosis not present

## 2018-02-12 DIAGNOSIS — E113219 Type 2 diabetes mellitus with mild nonproliferative diabetic retinopathy with macular edema, unspecified eye: Secondary | ICD-10-CM | POA: Diagnosis not present

## 2018-02-12 DIAGNOSIS — E11621 Type 2 diabetes mellitus with foot ulcer: Secondary | ICD-10-CM | POA: Diagnosis not present

## 2018-02-26 DIAGNOSIS — Z6839 Body mass index (BMI) 39.0-39.9, adult: Secondary | ICD-10-CM | POA: Diagnosis not present

## 2018-02-26 DIAGNOSIS — E1142 Type 2 diabetes mellitus with diabetic polyneuropathy: Secondary | ICD-10-CM | POA: Diagnosis not present

## 2018-02-26 DIAGNOSIS — Z794 Long term (current) use of insulin: Secondary | ICD-10-CM | POA: Diagnosis not present

## 2018-02-26 DIAGNOSIS — B353 Tinea pedis: Secondary | ICD-10-CM | POA: Diagnosis not present

## 2018-02-26 DIAGNOSIS — I10 Essential (primary) hypertension: Secondary | ICD-10-CM | POA: Diagnosis not present

## 2018-02-26 DIAGNOSIS — E1165 Type 2 diabetes mellitus with hyperglycemia: Secondary | ICD-10-CM | POA: Diagnosis not present

## 2018-03-05 ENCOUNTER — Encounter (INDEPENDENT_AMBULATORY_CARE_PROVIDER_SITE_OTHER): Payer: Self-pay | Admitting: Orthopedic Surgery

## 2018-03-05 ENCOUNTER — Ambulatory Visit (INDEPENDENT_AMBULATORY_CARE_PROVIDER_SITE_OTHER): Payer: Medicare HMO | Admitting: Orthopedic Surgery

## 2018-03-05 VITALS — Ht 76.0 in | Wt 270.0 lb

## 2018-03-05 DIAGNOSIS — M6701 Short Achilles tendon (acquired), right ankle: Secondary | ICD-10-CM | POA: Diagnosis not present

## 2018-03-05 DIAGNOSIS — I87323 Chronic venous hypertension (idiopathic) with inflammation of bilateral lower extremity: Secondary | ICD-10-CM | POA: Diagnosis not present

## 2018-03-05 DIAGNOSIS — M6702 Short Achilles tendon (acquired), left ankle: Secondary | ICD-10-CM

## 2018-03-05 DIAGNOSIS — L97529 Non-pressure chronic ulcer of other part of left foot with unspecified severity: Secondary | ICD-10-CM | POA: Diagnosis not present

## 2018-03-05 DIAGNOSIS — E11621 Type 2 diabetes mellitus with foot ulcer: Secondary | ICD-10-CM

## 2018-03-05 NOTE — Progress Notes (Signed)
Office Visit Note   Patient: Martin Gordon           Date of Birth: [birth year]           MRN: [id number] Visit Date: 03/05/2018              Requested by: Lavone Orn, MD [address] [place], [place] [address] PCP: Lavone Orn, MD  Chief Complaint  Patient presents with  . Left Foot - Follow-up      HPI: Patient is a 72 year old gentleman who complains of a black discoloration to the left third toe.  Patient has venous insufficiency but is not wearing his compression socks.  Assessment & Plan: Visit Diagnoses:  1. Diabetic ulcer of left great toe ([place])   2. Idiopathic chronic venous hypertension of both lower extremities with inflammation     Plan: Recommended wearing the compression socks daily.  Proper application was discussed.  Patient was given instruction and demonstrated heel cord stretching for his Achilles contracture.  Recommended [place] running sneakers.  Follow-Up Instructions: Return in about 3 months (around 06/05/2018).   Ortho Exam  Patient is alert, oriented, no adenopathy, well-dressed, normal affect, normal respiratory effort. Examination patient has pitting edema in the left lower extremity worse around the ankle.  Patient has no open ulcers no cellulitis no tenderness to palpation.  He does have a palpable dorsalis pedis pulse he has dorsiflexion to neutral with heel cord contracture he has clawing of the toes worse of the third toe he has a subungual hematoma of the third toe.  After informed consent the nails are trimmed x5 no signs of a paronychial infection no abscess.  Imaging: No results found. No images are attached to the encounter.  Labs: No results found for: HGBA1C, ESRSEDRATE, CRP, LABURIC, REPTSTATUS, GRAMSTAIN, CULT, LABORGA   No results found for: ALBUMIN, PREALBUMIN, LABURIC  Body mass index is 32.87 kg/m.  Orders:  No orders of the defined types were placed in this encounter.  No orders of the defined types  were placed in this encounter.    Procedures: No procedures performed  Clinical Data: No additional findings.  ROS:  All other systems negative, except as noted in the HPI. Review of Systems  Objective: Vital Signs: Ht 6\' 4"  (1.93 m)   Wt 270 lb (122.5 kg)   BMI 32.87 kg/m   Specialty Comments:  No specialty comments available.  PMFS History: Patient Active Problem List   Diagnosis Date Noted  . Diabetic ulcer of left great toe ([place]) 02/21/2017  . Subacute osteomyelitis of left foot ([place]) 02/21/2017  . Erectile dysfunction 07/04/2014   Past Medical History:  Diagnosis Date  . Diabetes mellitus without complication ([place])    oral med and insulin use  . Hyperlipidemia   . Hypertension     History reviewed. No pertinent family history.  Past Surgical History:  Procedure Laterality Date  . COLONOSCOPY WITH PROPOFOL N/A 09/24/2016   Procedure: COLONOSCOPY WITH PROPOFOL;  Surgeon: Garlan Fair, MD;  Location: [facility];  Service: Endoscopy;  Laterality: N/A;  . FLEXIBLE SIGMOIDOSCOPY N/A 06/17/2016   Procedure: FLEXIBLE SIGMOIDOSCOPY;  Surgeon: Garlan Fair, MD;  Location: [facility];  Service: Endoscopy;  Laterality: N/A;  Scope advanced to Sigmoid colon, Full colonoscopy aborted due to unsatisfactory prep.  Marland Kitchen HERNIA REPAIR     hx of   . PENILE PROSTHESIS IMPLANT N/A 07/04/2014   Procedure: PENILE PROTHESIS INFLATABLE, Three piece;  Surgeon: Claybon Jabs,  MD;  Location: [facility];  Service: Urology;  Laterality: N/A;   Social History   Occupational History  . Not on file  Tobacco Use  . Smoking status: Former Smoker    Packs/day: 2.00    Years: 30.00    Pack years: 60.00    Types: Cigarettes    Last attempt to quit: 09/16/1994    Years since quitting: 23.4  . Smokeless tobacco: Never Used  Substance and Sexual Activity  . Alcohol use: No    Comment: past hx stopped in 1993  . Drug use: No  . Sexual activity: Not on file

## 2018-03-12 DIAGNOSIS — N400 Enlarged prostate without lower urinary tract symptoms: Secondary | ICD-10-CM | POA: Diagnosis not present

## 2018-03-12 DIAGNOSIS — C61 Malignant neoplasm of prostate: Secondary | ICD-10-CM | POA: Diagnosis not present

## 2018-03-13 DIAGNOSIS — I10 Essential (primary) hypertension: Secondary | ICD-10-CM | POA: Diagnosis not present

## 2018-03-13 DIAGNOSIS — Z794 Long term (current) use of insulin: Secondary | ICD-10-CM | POA: Diagnosis not present

## 2018-03-13 DIAGNOSIS — E113219 Type 2 diabetes mellitus with mild nonproliferative diabetic retinopathy with macular edema, unspecified eye: Secondary | ICD-10-CM | POA: Diagnosis not present

## 2018-03-13 DIAGNOSIS — C61 Malignant neoplasm of prostate: Secondary | ICD-10-CM | POA: Diagnosis not present

## 2018-03-27 DIAGNOSIS — Z79899 Other long term (current) drug therapy: Secondary | ICD-10-CM | POA: Diagnosis not present

## 2018-03-27 DIAGNOSIS — I10 Essential (primary) hypertension: Secondary | ICD-10-CM | POA: Diagnosis not present

## 2018-04-14 DIAGNOSIS — E1142 Type 2 diabetes mellitus with diabetic polyneuropathy: Secondary | ICD-10-CM | POA: Diagnosis not present

## 2018-04-14 DIAGNOSIS — E1165 Type 2 diabetes mellitus with hyperglycemia: Secondary | ICD-10-CM | POA: Diagnosis not present

## 2018-04-14 DIAGNOSIS — E11621 Type 2 diabetes mellitus with foot ulcer: Secondary | ICD-10-CM | POA: Diagnosis not present

## 2018-04-14 DIAGNOSIS — C61 Malignant neoplasm of prostate: Secondary | ICD-10-CM | POA: Diagnosis not present

## 2018-04-14 DIAGNOSIS — Z794 Long term (current) use of insulin: Secondary | ICD-10-CM | POA: Diagnosis not present

## 2018-04-14 DIAGNOSIS — I10 Essential (primary) hypertension: Secondary | ICD-10-CM | POA: Diagnosis not present

## 2018-06-08 ENCOUNTER — Ambulatory Visit (INDEPENDENT_AMBULATORY_CARE_PROVIDER_SITE_OTHER): Payer: Medicare HMO | Admitting: Orthopedic Surgery

## 2018-06-15 DIAGNOSIS — E1165 Type 2 diabetes mellitus with hyperglycemia: Secondary | ICD-10-CM | POA: Diagnosis not present

## 2018-06-15 DIAGNOSIS — E1142 Type 2 diabetes mellitus with diabetic polyneuropathy: Secondary | ICD-10-CM | POA: Diagnosis not present

## 2018-06-15 DIAGNOSIS — E113213 Type 2 diabetes mellitus with mild nonproliferative diabetic retinopathy with macular edema, bilateral: Secondary | ICD-10-CM | POA: Diagnosis not present

## 2018-06-15 DIAGNOSIS — E11621 Type 2 diabetes mellitus with foot ulcer: Secondary | ICD-10-CM | POA: Diagnosis not present

## 2018-06-15 DIAGNOSIS — I10 Essential (primary) hypertension: Secondary | ICD-10-CM | POA: Diagnosis not present

## 2018-06-15 DIAGNOSIS — C61 Malignant neoplasm of prostate: Secondary | ICD-10-CM | POA: Diagnosis not present

## 2018-06-15 DIAGNOSIS — Z794 Long term (current) use of insulin: Secondary | ICD-10-CM | POA: Diagnosis not present

## 2018-07-08 DIAGNOSIS — Z0000 Encounter for general adult medical examination without abnormal findings: Secondary | ICD-10-CM | POA: Diagnosis not present

## 2018-07-08 DIAGNOSIS — I10 Essential (primary) hypertension: Secondary | ICD-10-CM | POA: Diagnosis not present

## 2018-07-08 DIAGNOSIS — E7800 Pure hypercholesterolemia, unspecified: Secondary | ICD-10-CM | POA: Diagnosis not present

## 2018-07-08 DIAGNOSIS — E113219 Type 2 diabetes mellitus with mild nonproliferative diabetic retinopathy with macular edema, unspecified eye: Secondary | ICD-10-CM | POA: Diagnosis not present

## 2018-07-08 DIAGNOSIS — G4733 Obstructive sleep apnea (adult) (pediatric): Secondary | ICD-10-CM | POA: Diagnosis not present

## 2018-07-08 DIAGNOSIS — E1142 Type 2 diabetes mellitus with diabetic polyneuropathy: Secondary | ICD-10-CM | POA: Diagnosis not present

## 2018-07-08 DIAGNOSIS — Z23 Encounter for immunization: Secondary | ICD-10-CM | POA: Diagnosis not present

## 2018-07-08 DIAGNOSIS — Z1389 Encounter for screening for other disorder: Secondary | ICD-10-CM | POA: Diagnosis not present

## 2018-07-08 DIAGNOSIS — Z1159 Encounter for screening for other viral diseases: Secondary | ICD-10-CM | POA: Diagnosis not present

## 2018-07-14 ENCOUNTER — Emergency Department (HOSPITAL_COMMUNITY)
Admission: EM | Admit: 2018-07-14 | Discharge: 2018-07-14 | Disposition: A | Discharge status: Home / Self Care | Payer: Medicare HMO | Attending: Emergency Medicine | Admitting: Emergency Medicine

## 2018-07-14 ENCOUNTER — Other Ambulatory Visit: Payer: Self-pay

## 2018-07-14 ENCOUNTER — Encounter (HOSPITAL_COMMUNITY): Payer: Self-pay | Admitting: Emergency Medicine

## 2018-07-14 DIAGNOSIS — Z87891 Personal history of nicotine dependence: Secondary | ICD-10-CM | POA: Diagnosis not present

## 2018-07-14 DIAGNOSIS — R339 Retention of urine, unspecified: Secondary | ICD-10-CM | POA: Insufficient documentation

## 2018-07-14 DIAGNOSIS — Z794 Long term (current) use of insulin: Secondary | ICD-10-CM | POA: Diagnosis not present

## 2018-07-14 DIAGNOSIS — I10 Essential (primary) hypertension: Secondary | ICD-10-CM | POA: Diagnosis not present

## 2018-07-14 DIAGNOSIS — Z7982 Long term (current) use of aspirin: Secondary | ICD-10-CM | POA: Insufficient documentation

## 2018-07-14 DIAGNOSIS — Z79899 Other long term (current) drug therapy: Secondary | ICD-10-CM | POA: Insufficient documentation

## 2018-07-14 DIAGNOSIS — E119 Type 2 diabetes mellitus without complications: Secondary | ICD-10-CM | POA: Insufficient documentation

## 2018-07-14 LAB — URINALYSIS, ROUTINE W REFLEX MICROSCOPIC
Bilirubin Urine: NEGATIVE
Glucose, UA: NEGATIVE mg/dL
Ketones, ur: NEGATIVE mg/dL
Leukocytes, UA: NEGATIVE
Nitrite: NEGATIVE
PROTEIN: NEGATIVE mg/dL
SPECIFIC GRAVITY, URINE: 1.008 (ref 1.005–1.030)
pH: 5 (ref 5.0–8.0)

## 2018-07-14 NOTE — Discharge Instructions (Signed)
Please read and follow all provided instructions.  Your diagnoses today include:  1. Urinary retention    Tests performed today include:  Urine test -does not show any infection  Vital signs. See below for your results today.   Medications prescribed:   None  Take any prescribed medications only as directed.  Home care instructions:  Follow any educational materials contained in this packet.  Follow-up instructions: Please call your urologist tomorrow to schedule a follow-up appointment for removal of catheter and voiding trial.   Return instructions:   Please return to the Emergency Department if you experience worsening symptoms.   Please return if you have any other emergent concerns.  Additional Information:  Your vital signs today were: BP 129/67 (BP Location: Right Arm)    Pulse 93    Temp 98.1 F (36.7 C) (Oral)    Resp 17    Ht 6\' 4"  (1.93 m)    Wt (!) 137 kg    SpO2 94%    BMI 36.76 kg/m  If your blood pressure (BP) was elevated above 135/85 this visit, please have this repeated by your doctor within one month. --------------

## 2018-07-14 NOTE — ED Notes (Addendum)
Pt reports urinary retention since 1830 this evening.  Denies hx of BPH.  Never had a foley catheter in the past.

## 2018-07-14 NOTE — ED Triage Notes (Signed)
Pt reports he has been unable to urinate since 7pm which was just a dribble. Pt reports bladder pain and that he has to "pee real bad". Hx of htn which he is compliant with medications. Attempted to bladder scan in triage, isn't working.

## 2018-07-14 NOTE — ED Notes (Addendum)
Bladder scan reattempted in triage for retention, 768mls noted.

## 2018-07-14 NOTE — ED Notes (Signed)
Urine culture tube sent down with urine sample.   

## 2018-07-14 NOTE — ED Provider Notes (Signed)
[facility] EMERGENCY DEPARTMENT Provider Note   CSN: [id number] Arrival date & time: 07/14/18  2101     History   Chief Complaint Chief Complaint  Patient presents with  . Urinary Retention    HPI Martin Gordon is a 72 y.o. male.  Patient with history of diabetes, hyperlipidemia, hypertension  -- presents with acute onset of urinary retention starting approximately 7 PM.  Patient does not have any history of prostate enlargement.  He reports sometimes having a slow stream at night.  No previous episodes of urinary retention.  Denies any recent dysuria, increased frequency or urgency.  Foley catheter placed upon arrival to the emergency department with significant improvement in symptoms.  Patient denies antihistamine use.  No fever.  No chest pain or shortness of breath.  Onset of symptoms acute.  Course is resolved.     Past Medical History:  Diagnosis Date  . Diabetes mellitus without complication ([place])    oral med and insulin use  . Hyperlipidemia   . Hypertension     Patient Active Problem List   Diagnosis Date Noted  . Diabetic ulcer of left great toe ([place]) 02/21/2017  . Subacute osteomyelitis of left foot ([place]) 02/21/2017  . Erectile dysfunction 07/04/2014    Past Surgical History:  Procedure Laterality Date  . COLONOSCOPY WITH PROPOFOL N/A 09/24/2016   Procedure: COLONOSCOPY WITH PROPOFOL;  Surgeon: Garlan Fair, MD;  Location: [facility];  Service: Endoscopy;  Laterality: N/A;  . FLEXIBLE SIGMOIDOSCOPY N/A 06/17/2016   Procedure: FLEXIBLE SIGMOIDOSCOPY;  Surgeon: Garlan Fair, MD;  Location: [facility];  Service: Endoscopy;  Laterality: N/A;  Scope advanced to Sigmoid colon, Full colonoscopy aborted due to unsatisfactory prep.  Marland Kitchen HERNIA REPAIR     hx of   . PENILE PROSTHESIS IMPLANT N/A 07/04/2014   Procedure: PENILE PROTHESIS INFLATABLE, Three piece;  Surgeon: Claybon Jabs, MD;  Location: [facility];  Service:  Urology;  Laterality: N/A;        Home Medications    Prior to Admission medications   Medication Sig Start Date End Date Taking? Authorizing Provider  amlodipine-atorvastatin (CADUET) 10-20 MG tablet  03/13/17   [provider]  aspirin EC 81 MG tablet Take 81 mg by mouth every morning.    [provider]  insulin glargine (LANTUS) 100 UNIT/ML injection Inject 45 Units into the skin at bedtime.     [provider]  insulin lispro (HUMALOG) 100 UNIT/ML injection Inject 14 Units into the skin 2 (two) times daily.     [provider]  lisinopril (PRINIVIL,ZESTRIL) 20 MG tablet Take 20 mg by mouth every morning.    [provider]  metFORMIN (GLUCOPHAGE) 1000 MG tablet Take 1,000 mg by mouth 2 (two) times daily with a meal.    [provider]  Multiple Vitamin (MULTIVITAMIN WITH MINERALS) TABS tablet Take 1 tablet by mouth every morning.    [provider]  Omega-3 Fatty Acids (FISH OIL PO) Take 1 tablet by mouth daily.     [provider]    Family History No family history on file.  Social History Social History   Tobacco Use  . Smoking status: Former Smoker    Packs/day: 2.00    Years: 30.00    Pack years: 60.00    Types: Cigarettes    Last attempt to quit: 09/16/1994    Years since quitting: 23.8  . Smokeless tobacco: Never Used  Substance Use Topics  .  Alcohol use: No    Comment: past hx stopped in 1993  . Drug use: No     Allergies   Patient has no known allergies.   Review of Systems Review of Systems  Constitutional: Negative for fever.  HENT: Negative for rhinorrhea and sore throat.   Eyes: Negative for redness.  Respiratory: Negative for cough.   Cardiovascular: Negative for chest pain.  Gastrointestinal: Positive for abdominal pain. Negative for diarrhea, nausea and vomiting.  Genitourinary: Positive for difficulty urinating. Negative for dysuria, frequency, hematuria and urgency.    Musculoskeletal: Negative for myalgias.  Skin: Negative for rash.  Neurological: Negative for headaches.     Physical Exam Updated Vital Signs BP (!) 195/110 (BP Location: Right Arm)   Pulse (!) 120   Temp 98.1 F (36.7 C) (Oral)   Resp 18   Ht 6\' 4"  (1.93 m)   Wt (!) 137 kg   SpO2 96%   BMI 36.76 kg/m   Physical Exam  Constitutional: He appears well-developed and well-nourished.  HENT:  Head: Normocephalic and atraumatic.  Eyes: Conjunctivae are normal. Right eye exhibits no discharge. Left eye exhibits no discharge.  Neck: Normal range of motion. Neck supple.  Cardiovascular: Normal rate, regular rhythm and normal heart sounds.  Pulmonary/Chest: Effort normal and breath sounds normal.  Abdominal: Soft. There is no tenderness.  Neurological: He is alert.  Skin: Skin is warm and dry.  Psychiatric: He has a normal mood and affect.  Nursing note and vitals reviewed.    ED Treatments / Results  Labs (all labs ordered are listed, but only abnormal results are displayed) Labs Reviewed  URINALYSIS, ROUTINE W REFLEX MICROSCOPIC - Abnormal; Notable for the following components:      Result Value   Color, Urine STRAW (*)    Hgb urine dipstick LARGE (*)    Bacteria, UA RARE (*)    All other components within normal limits    EKG None  Radiology No results found.  Procedures Procedures (including critical care time)  Medications Ordered in ED Medications - No data to display   Initial Impression / Assessment and Plan / ED Course  I have reviewed the triage vital signs and the nursing notes.  Pertinent labs & imaging results that were available during my care of the patient were reviewed by me and considered in my medical decision making (see chart for details).     Patient seen and examined.  Patient seen after placement of Foley catheter.  He is much more comfortable per his report.  Resting comfortably in the bed.  Light yellow urine, clear, in  tubing.  Vital signs reviewed and are as follows: BP (!) 195/110 (BP Location: Right Arm)   Pulse (!) 120   Temp 98.1 F (36.7 C) (Oral)   Resp 18   Ht 6\' 4"  (1.93 m)   Wt (!) 137 kg   SpO2 96%   BMI 36.76 kg/m   11:33 PM patient discussed with and seen by Dr. Ronnald Nian.  No infection on UA.  Patient will be given a leg bag and discharged home.  Patient is to follow-up with his urologist and call for an appointment tomorrow.  He verbalizes understanding agrees with plan.  Final Clinical Impressions(s) / ED Diagnoses   Final diagnoses:  Urinary retention   Patient with acute urinary retention this evening, first episode.  No signs of infection on UA.  Patient does have some signs of BPH per history.  He does see urology  and has appropriate follow-up.  ED Discharge Orders    None       Carlisle Cater, PA-C 07/14/18 [place], [place], DO 07/14/18 2349

## 2018-07-15 IMAGING — MR MR HEAD WO/W CM
11 series · 47 of 48 positions shown · IV contrast (20ml Multihance)
Comparison: None.

CLINICAL DATA: Gait disturbance and balance disturbance over the
last 6 months.

EXAM:
MRI HEAD WITHOUT AND WITH CONTRAST
TECHNIQUE: Multiplanar, multiecho pulse sequences of the brain and surrounding
structures were obtained without and with intravenous contrast.
CONTRAST:  20mL MULTIHANCE GADOBENATE DIMEGLUMINE 529 MG/ML IV SOLN

[Series 4: t1_se_sag · sagittal · 5.0mm · 0.47mm/px · 3 of 23 slices shown]
[im 1/23]
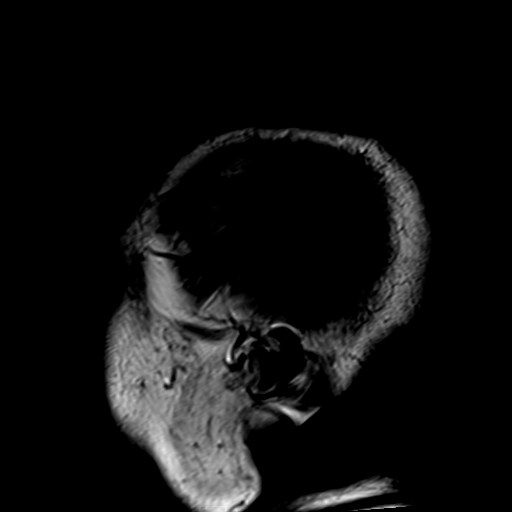
[im 12/23]
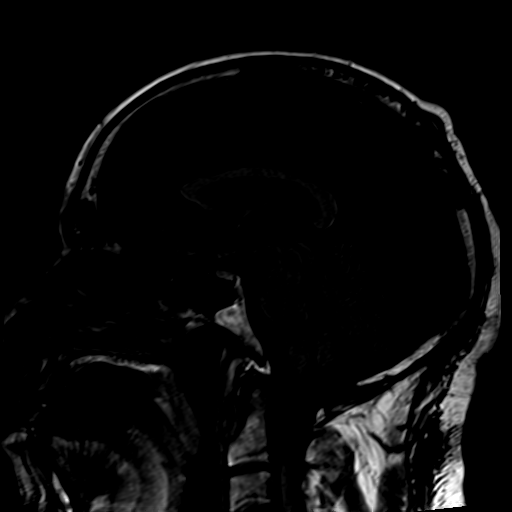
[im 23/23]
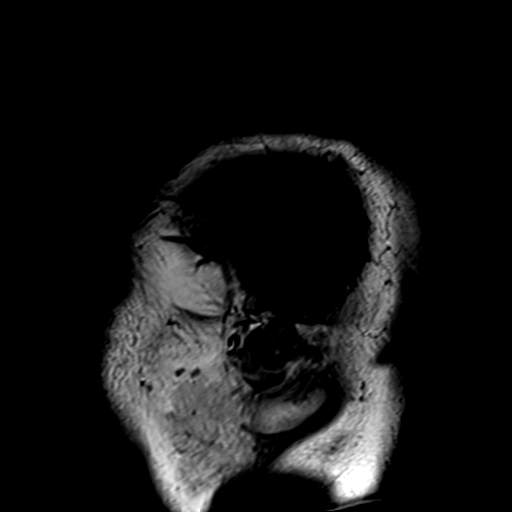

[Series 5: ep2d_diff_(id)_trace · axial · 3.0mm · 1.88mm/px · z∈[-56,+96]mm · 8 of 101 slices shown]
[im 1/101]
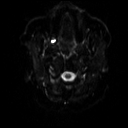
[im 15/101]
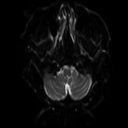
[im 29/101]
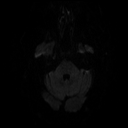
[im 43/101]
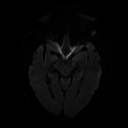
[im 58/101]
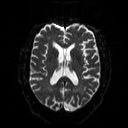
[im 72/101]
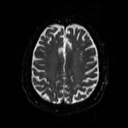
[im 86/101]
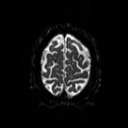
[im 101/101]
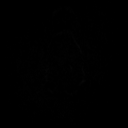

[Series 8: ep2d_diff_cor_adc · coronal · 5.0mm · 1.77mm/px · 2 of 27 slices shown]
[im 1/27]
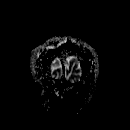
[im 27/27]
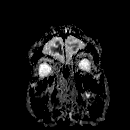

[Series 9: FLAIR · axial · 3.0mm · 0.47mm/px · z∈[-71,+114]mm · 2 of 32 slices shown]
[im 1/32]
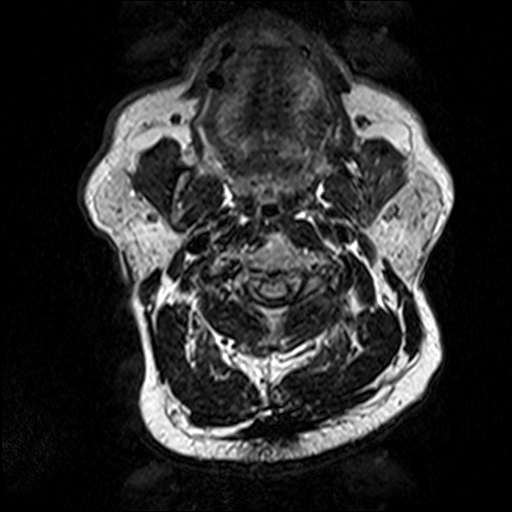
[im 32/32]
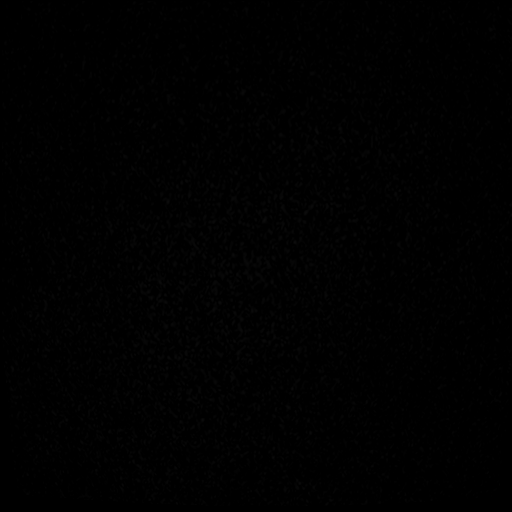

[Series 12: swi_images · axial · 4.0mm · 0.94mm/px · z∈[-55,+100]mm · 3 of 40 slices shown]
[im 1/40]
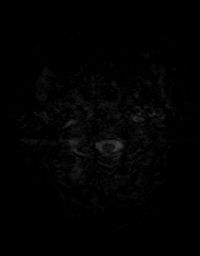
[im 20/40]
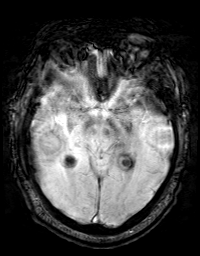
[im 40/40]
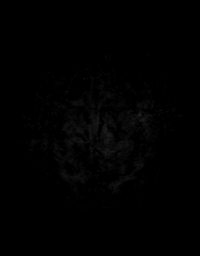

[Series 13: t2_tse_tra · axial · 5.0mm · 0.75mm/px · z∈[-61,+106]mm · 2 of 28 slices shown]
[im 1/28]
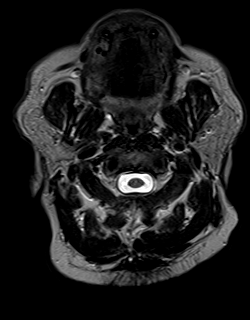
[im 28/28]
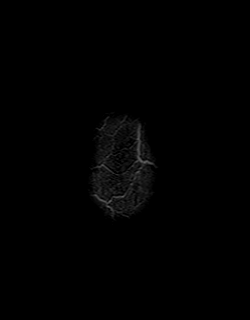

[Series 14: t1_mpr_tra · axial · 1.0mm · 0.75mm/px · z∈[-49,+94]mm · 11 of 144 slices shown]
[im 1/144]
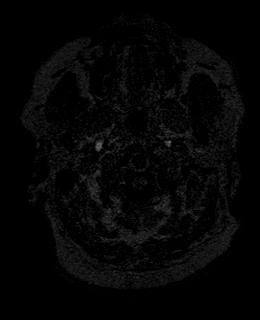
[im 15/144]
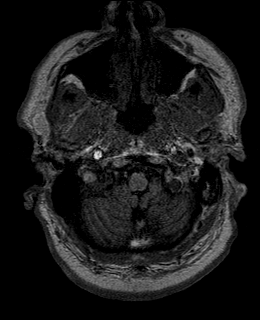
[im 29/144]
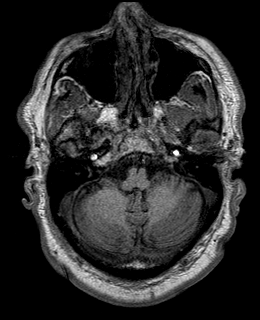
[im 43/144]
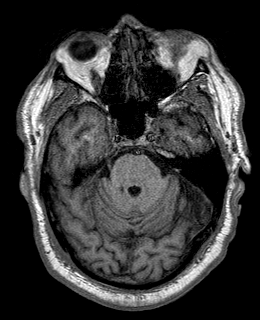
[im 58/144]
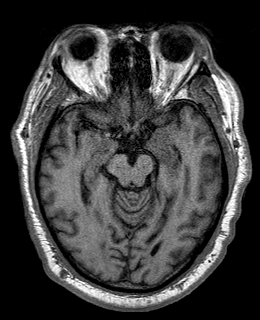
[im 72/144]
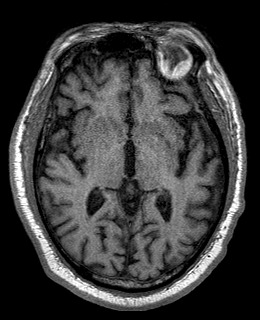
[im 86/144]
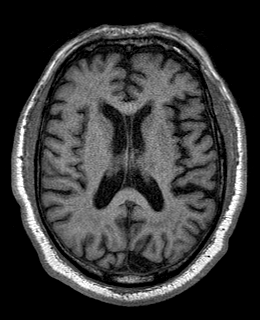
[im 101/144]
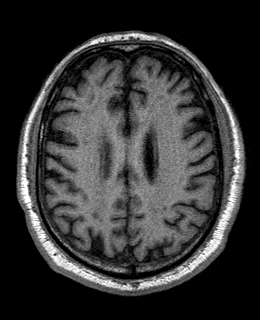
[im 115/144]
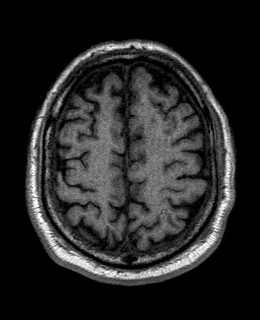
[im 129/144]
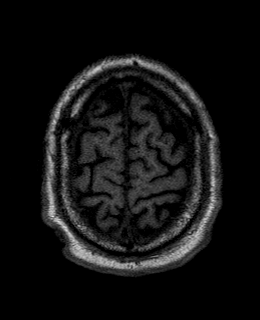
[im 144/144]
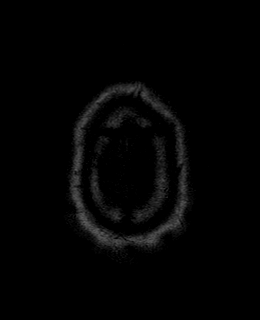

[Series 15: T2 post-contrast · coronal · 5.0mm · 0.47mm/px · 2 of 30 slices shown]
[im 1/30]
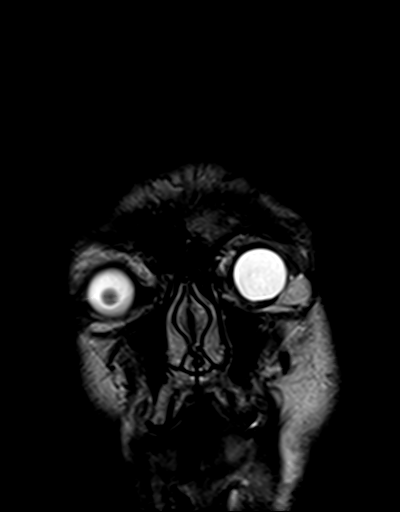
[im 30/30]
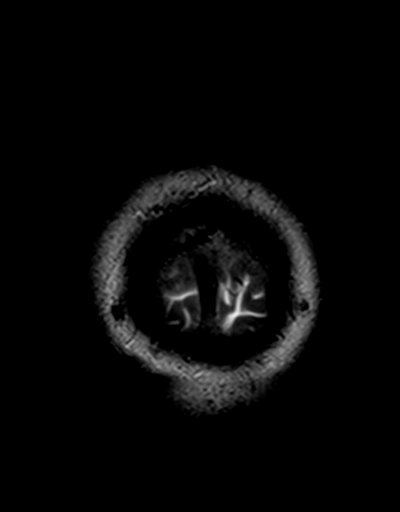

[Series 16: post t1_mpr_tra · axial · 1.0mm · 0.75mm/px · z∈[-49,+94]mm · 10 of 144 slices shown]
[im 1/144]
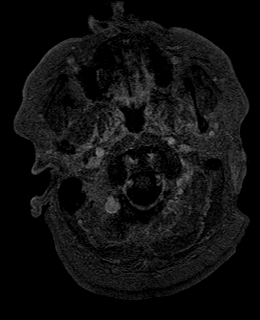
[im 15/144]
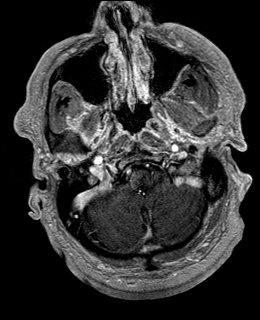
[im 29/144]
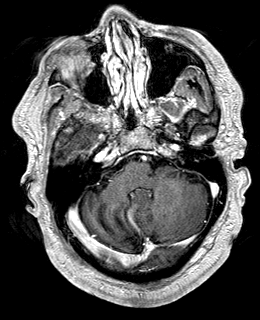
[im 43/144]
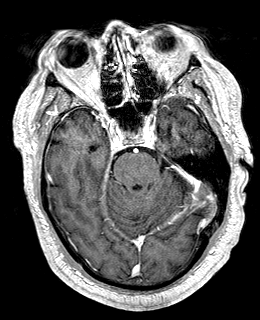
[im 58/144]
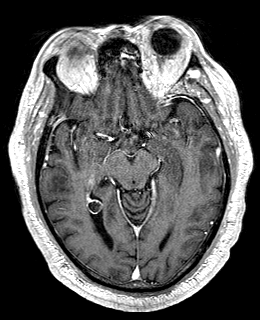
[im 72/144]
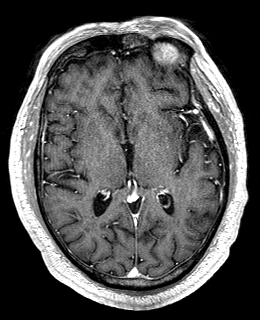
[im 86/144]
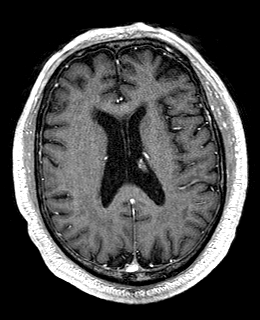
[im 101/144]
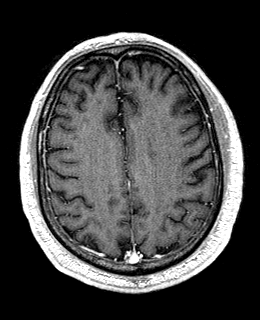
[im 115/144]
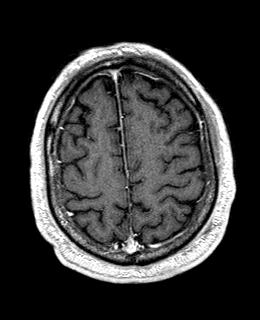
[im 144/144]
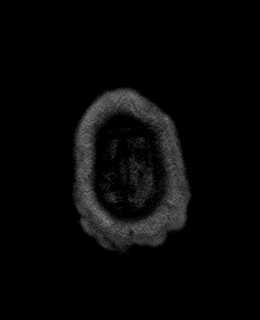

[Series 17: T1 post-contrast · coronal · 5.0mm · 0.72mm/px · 2 of 30 slices shown]
[im 1/30]
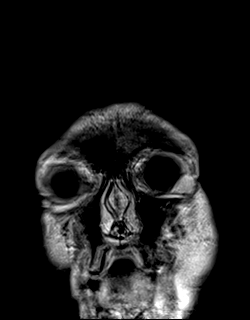
[im 30/30]
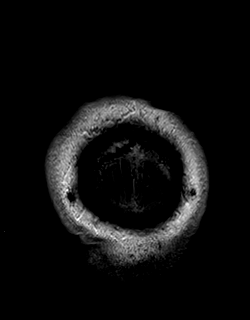

[Series 18: t1_se_sag post · sagittal · 5.0mm · 0.47mm/px · 2 of 23 slices shown]
[im 1/23]
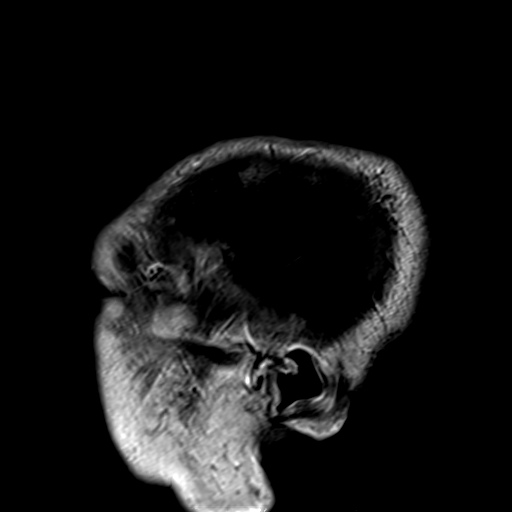
[im 23/23]
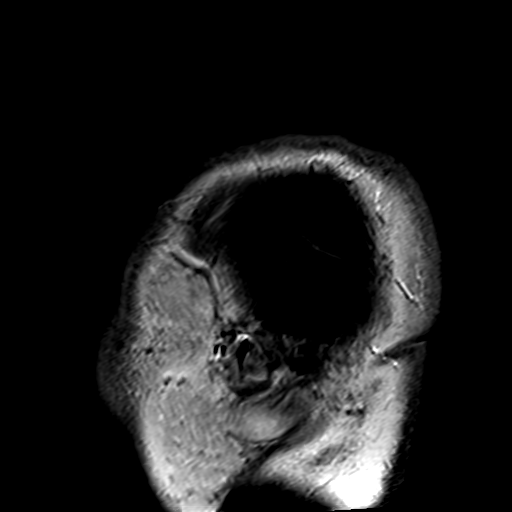

[47 of 48 positions shown; findings below may reference images not displayed]

FINDINGS: Brain: Diffusion imaging does not show any acute or subacute
infarction. There is a degree of generalized atrophy. No abnormality
is seen affecting the brainstem or cerebellum. Cerebral hemispheres
show mild to moderate chronic small-vessel ischemic changes of the
deep white matter. No cortical or large vessel territory infarction.
No mass lesion, hemorrhage, hydrocephalus or extra-axial collection.

Vascular: Major vessels at the base of the brain show flow.

Skull and upper cervical spine: Negative

Sinuses/Orbits: Mucosal inflammatory changes of the maxillary
sinuses. Other sinuses clear. Orbits negative. Likely insignificant
1 cm fluid intensity structure lateral to the left orbit, with
enhancement pattern probably representing a venous varix. This
should not be significant.

Other: None
IMPRESSION: No acute or reversible finding. No specific cause of the presenting
symptoms is identified. Generalized atrophy with mild to moderate
chronic small-vessel changes of the hemispheric white matter.

## 2018-07-16 DIAGNOSIS — Z7984 Long term (current) use of oral hypoglycemic drugs: Secondary | ICD-10-CM | POA: Diagnosis not present

## 2018-07-16 DIAGNOSIS — Z794 Long term (current) use of insulin: Secondary | ICD-10-CM | POA: Diagnosis not present

## 2018-07-16 DIAGNOSIS — I10 Essential (primary) hypertension: Secondary | ICD-10-CM | POA: Diagnosis not present

## 2018-07-16 DIAGNOSIS — C61 Malignant neoplasm of prostate: Secondary | ICD-10-CM | POA: Diagnosis not present

## 2018-07-16 DIAGNOSIS — E113219 Type 2 diabetes mellitus with mild nonproliferative diabetic retinopathy with macular edema, unspecified eye: Secondary | ICD-10-CM | POA: Diagnosis not present

## 2018-07-16 DIAGNOSIS — E1142 Type 2 diabetes mellitus with diabetic polyneuropathy: Secondary | ICD-10-CM | POA: Diagnosis not present

## 2018-07-16 DIAGNOSIS — E11621 Type 2 diabetes mellitus with foot ulcer: Secondary | ICD-10-CM | POA: Diagnosis not present

## 2018-07-16 DIAGNOSIS — E1165 Type 2 diabetes mellitus with hyperglycemia: Secondary | ICD-10-CM | POA: Diagnosis not present

## 2018-07-21 DIAGNOSIS — C61 Malignant neoplasm of prostate: Secondary | ICD-10-CM | POA: Diagnosis not present

## 2018-07-21 DIAGNOSIS — R338 Other retention of urine: Secondary | ICD-10-CM | POA: Diagnosis not present

## 2018-09-01 DIAGNOSIS — E1165 Type 2 diabetes mellitus with hyperglycemia: Secondary | ICD-10-CM | POA: Diagnosis not present

## 2018-09-01 DIAGNOSIS — E11621 Type 2 diabetes mellitus with foot ulcer: Secondary | ICD-10-CM | POA: Diagnosis not present

## 2018-09-01 DIAGNOSIS — E1142 Type 2 diabetes mellitus with diabetic polyneuropathy: Secondary | ICD-10-CM | POA: Diagnosis not present

## 2018-09-01 DIAGNOSIS — I10 Essential (primary) hypertension: Secondary | ICD-10-CM | POA: Diagnosis not present

## 2018-09-01 DIAGNOSIS — E113219 Type 2 diabetes mellitus with mild nonproliferative diabetic retinopathy with macular edema, unspecified eye: Secondary | ICD-10-CM | POA: Diagnosis not present

## 2018-09-01 DIAGNOSIS — C61 Malignant neoplasm of prostate: Secondary | ICD-10-CM | POA: Diagnosis not present

## 2018-09-03 DIAGNOSIS — C61 Malignant neoplasm of prostate: Secondary | ICD-10-CM | POA: Diagnosis not present

## 2018-09-03 DIAGNOSIS — N400 Enlarged prostate without lower urinary tract symptoms: Secondary | ICD-10-CM | POA: Diagnosis not present

## 2018-10-01 DIAGNOSIS — C61 Malignant neoplasm of prostate: Secondary | ICD-10-CM | POA: Diagnosis not present

## 2018-10-08 DIAGNOSIS — C61 Malignant neoplasm of prostate: Secondary | ICD-10-CM | POA: Diagnosis not present

## 2018-11-13 DIAGNOSIS — E1142 Type 2 diabetes mellitus with diabetic polyneuropathy: Secondary | ICD-10-CM | POA: Diagnosis not present

## 2018-11-13 DIAGNOSIS — C61 Malignant neoplasm of prostate: Secondary | ICD-10-CM | POA: Diagnosis not present

## 2018-11-13 DIAGNOSIS — E113213 Type 2 diabetes mellitus with mild nonproliferative diabetic retinopathy with macular edema, bilateral: Secondary | ICD-10-CM | POA: Diagnosis not present

## 2018-11-13 DIAGNOSIS — I10 Essential (primary) hypertension: Secondary | ICD-10-CM | POA: Diagnosis not present

## 2018-11-20 DIAGNOSIS — E1165 Type 2 diabetes mellitus with hyperglycemia: Secondary | ICD-10-CM | POA: Diagnosis not present

## 2018-11-20 DIAGNOSIS — Z794 Long term (current) use of insulin: Secondary | ICD-10-CM | POA: Diagnosis not present

## 2018-12-01 DIAGNOSIS — Z794 Long term (current) use of insulin: Secondary | ICD-10-CM | POA: Diagnosis not present

## 2018-12-01 DIAGNOSIS — E1165 Type 2 diabetes mellitus with hyperglycemia: Secondary | ICD-10-CM | POA: Diagnosis not present

## 2018-12-01 DIAGNOSIS — Z7984 Long term (current) use of oral hypoglycemic drugs: Secondary | ICD-10-CM | POA: Diagnosis not present

## 2018-12-03 DIAGNOSIS — H524 Presbyopia: Secondary | ICD-10-CM | POA: Diagnosis not present

## 2018-12-03 DIAGNOSIS — I10 Essential (primary) hypertension: Secondary | ICD-10-CM | POA: Diagnosis not present

## 2018-12-03 DIAGNOSIS — H25099 Other age-related incipient cataract, unspecified eye: Secondary | ICD-10-CM | POA: Diagnosis not present

## 2018-12-03 DIAGNOSIS — H5203 Hypermetropia, bilateral: Secondary | ICD-10-CM | POA: Diagnosis not present

## 2018-12-03 DIAGNOSIS — Z794 Long term (current) use of insulin: Secondary | ICD-10-CM | POA: Diagnosis not present

## 2018-12-03 DIAGNOSIS — H52223 Regular astigmatism, bilateral: Secondary | ICD-10-CM | POA: Diagnosis not present

## 2018-12-03 DIAGNOSIS — Z7984 Long term (current) use of oral hypoglycemic drugs: Secondary | ICD-10-CM | POA: Diagnosis not present

## 2018-12-03 DIAGNOSIS — Z0100 Encounter for examination of eyes and vision without abnormal findings: Secondary | ICD-10-CM | POA: Diagnosis not present

## 2018-12-03 DIAGNOSIS — E119 Type 2 diabetes mellitus without complications: Secondary | ICD-10-CM | POA: Diagnosis not present

## 2018-12-03 DIAGNOSIS — H11159 Pinguecula, unspecified eye: Secondary | ICD-10-CM | POA: Diagnosis not present

## 2018-12-07 DIAGNOSIS — E1142 Type 2 diabetes mellitus with diabetic polyneuropathy: Secondary | ICD-10-CM | POA: Diagnosis not present

## 2018-12-07 DIAGNOSIS — Z794 Long term (current) use of insulin: Secondary | ICD-10-CM | POA: Diagnosis not present

## 2018-12-22 ENCOUNTER — Other Ambulatory Visit: Payer: Self-pay

## 2018-12-22 ENCOUNTER — Ambulatory Visit: Payer: Medicare HMO | Admitting: Emergency Medicine

## 2019-01-18 DIAGNOSIS — Z7984 Long term (current) use of oral hypoglycemic drugs: Secondary | ICD-10-CM | POA: Diagnosis not present

## 2019-01-18 DIAGNOSIS — E1165 Type 2 diabetes mellitus with hyperglycemia: Secondary | ICD-10-CM | POA: Diagnosis not present

## 2019-01-18 DIAGNOSIS — E11621 Type 2 diabetes mellitus with foot ulcer: Secondary | ICD-10-CM | POA: Diagnosis not present

## 2019-01-18 DIAGNOSIS — I10 Essential (primary) hypertension: Secondary | ICD-10-CM | POA: Diagnosis not present

## 2019-01-18 DIAGNOSIS — E113213 Type 2 diabetes mellitus with mild nonproliferative diabetic retinopathy with macular edema, bilateral: Secondary | ICD-10-CM | POA: Diagnosis not present

## 2019-01-18 DIAGNOSIS — Z794 Long term (current) use of insulin: Secondary | ICD-10-CM | POA: Diagnosis not present

## 2019-01-18 DIAGNOSIS — C61 Malignant neoplasm of prostate: Secondary | ICD-10-CM | POA: Diagnosis not present

## 2019-01-18 DIAGNOSIS — E1142 Type 2 diabetes mellitus with diabetic polyneuropathy: Secondary | ICD-10-CM | POA: Diagnosis not present

## 2019-03-15 DIAGNOSIS — E1142 Type 2 diabetes mellitus with diabetic polyneuropathy: Secondary | ICD-10-CM | POA: Diagnosis not present

## 2019-03-15 DIAGNOSIS — Z794 Long term (current) use of insulin: Secondary | ICD-10-CM | POA: Diagnosis not present

## 2019-03-15 DIAGNOSIS — G4733 Obstructive sleep apnea (adult) (pediatric): Secondary | ICD-10-CM | POA: Diagnosis not present

## 2019-03-15 DIAGNOSIS — E113213 Type 2 diabetes mellitus with mild nonproliferative diabetic retinopathy with macular edema, bilateral: Secondary | ICD-10-CM | POA: Diagnosis not present

## 2019-03-15 DIAGNOSIS — I10 Essential (primary) hypertension: Secondary | ICD-10-CM | POA: Diagnosis not present

## 2019-03-17 DIAGNOSIS — Z7984 Long term (current) use of oral hypoglycemic drugs: Secondary | ICD-10-CM | POA: Diagnosis not present

## 2019-03-17 DIAGNOSIS — Z794 Long term (current) use of insulin: Secondary | ICD-10-CM | POA: Diagnosis not present

## 2019-03-17 DIAGNOSIS — E113219 Type 2 diabetes mellitus with mild nonproliferative diabetic retinopathy with macular edema, unspecified eye: Secondary | ICD-10-CM | POA: Diagnosis not present

## 2019-03-17 DIAGNOSIS — E1165 Type 2 diabetes mellitus with hyperglycemia: Secondary | ICD-10-CM | POA: Diagnosis not present

## 2019-04-01 DIAGNOSIS — C61 Malignant neoplasm of prostate: Secondary | ICD-10-CM | POA: Diagnosis not present

## 2019-04-06 DIAGNOSIS — C61 Malignant neoplasm of prostate: Secondary | ICD-10-CM | POA: Diagnosis not present

## 2019-04-06 DIAGNOSIS — E113213 Type 2 diabetes mellitus with mild nonproliferative diabetic retinopathy with macular edema, bilateral: Secondary | ICD-10-CM | POA: Diagnosis not present

## 2019-04-06 DIAGNOSIS — I10 Essential (primary) hypertension: Secondary | ICD-10-CM | POA: Diagnosis not present

## 2019-04-06 DIAGNOSIS — E1165 Type 2 diabetes mellitus with hyperglycemia: Secondary | ICD-10-CM | POA: Diagnosis not present

## 2019-04-06 DIAGNOSIS — E11621 Type 2 diabetes mellitus with foot ulcer: Secondary | ICD-10-CM | POA: Diagnosis not present

## 2019-04-06 DIAGNOSIS — E1142 Type 2 diabetes mellitus with diabetic polyneuropathy: Secondary | ICD-10-CM | POA: Diagnosis not present

## 2019-04-06 DIAGNOSIS — Z794 Long term (current) use of insulin: Secondary | ICD-10-CM | POA: Diagnosis not present

## 2019-04-06 DIAGNOSIS — Z7984 Long term (current) use of oral hypoglycemic drugs: Secondary | ICD-10-CM | POA: Diagnosis not present

## 2019-04-08 DIAGNOSIS — N400 Enlarged prostate without lower urinary tract symptoms: Secondary | ICD-10-CM | POA: Diagnosis not present

## 2019-04-08 DIAGNOSIS — C61 Malignant neoplasm of prostate: Secondary | ICD-10-CM | POA: Diagnosis not present

## 2019-05-11 DIAGNOSIS — Z794 Long term (current) use of insulin: Secondary | ICD-10-CM | POA: Diagnosis not present

## 2019-05-11 DIAGNOSIS — Z7984 Long term (current) use of oral hypoglycemic drugs: Secondary | ICD-10-CM | POA: Diagnosis not present

## 2019-05-11 DIAGNOSIS — I10 Essential (primary) hypertension: Secondary | ICD-10-CM | POA: Diagnosis not present

## 2019-05-11 DIAGNOSIS — E1165 Type 2 diabetes mellitus with hyperglycemia: Secondary | ICD-10-CM | POA: Diagnosis not present

## 2019-05-11 DIAGNOSIS — E113213 Type 2 diabetes mellitus with mild nonproliferative diabetic retinopathy with macular edema, bilateral: Secondary | ICD-10-CM | POA: Diagnosis not present

## 2019-05-11 DIAGNOSIS — C61 Malignant neoplasm of prostate: Secondary | ICD-10-CM | POA: Diagnosis not present

## 2019-05-11 DIAGNOSIS — E11621 Type 2 diabetes mellitus with foot ulcer: Secondary | ICD-10-CM | POA: Diagnosis not present

## 2019-05-11 DIAGNOSIS — E1142 Type 2 diabetes mellitus with diabetic polyneuropathy: Secondary | ICD-10-CM | POA: Diagnosis not present

## 2019-06-10 DIAGNOSIS — E11621 Type 2 diabetes mellitus with foot ulcer: Secondary | ICD-10-CM | POA: Diagnosis not present

## 2019-06-10 DIAGNOSIS — I10 Essential (primary) hypertension: Secondary | ICD-10-CM | POA: Diagnosis not present

## 2019-06-10 DIAGNOSIS — E1142 Type 2 diabetes mellitus with diabetic polyneuropathy: Secondary | ICD-10-CM | POA: Diagnosis not present

## 2019-06-10 DIAGNOSIS — E113219 Type 2 diabetes mellitus with mild nonproliferative diabetic retinopathy with macular edema, unspecified eye: Secondary | ICD-10-CM | POA: Diagnosis not present

## 2019-06-10 DIAGNOSIS — E1165 Type 2 diabetes mellitus with hyperglycemia: Secondary | ICD-10-CM | POA: Diagnosis not present

## 2019-06-10 DIAGNOSIS — C61 Malignant neoplasm of prostate: Secondary | ICD-10-CM | POA: Diagnosis not present

## 2019-07-15 DIAGNOSIS — E7800 Pure hypercholesterolemia, unspecified: Secondary | ICD-10-CM | POA: Diagnosis not present

## 2019-07-15 DIAGNOSIS — I10 Essential (primary) hypertension: Secondary | ICD-10-CM | POA: Diagnosis not present

## 2019-07-15 DIAGNOSIS — E1165 Type 2 diabetes mellitus with hyperglycemia: Secondary | ICD-10-CM | POA: Diagnosis not present

## 2019-07-15 DIAGNOSIS — E113213 Type 2 diabetes mellitus with mild nonproliferative diabetic retinopathy with macular edema, bilateral: Secondary | ICD-10-CM | POA: Diagnosis not present

## 2019-07-15 DIAGNOSIS — C61 Malignant neoplasm of prostate: Secondary | ICD-10-CM | POA: Diagnosis not present

## 2019-07-15 DIAGNOSIS — E11621 Type 2 diabetes mellitus with foot ulcer: Secondary | ICD-10-CM | POA: Diagnosis not present

## 2019-07-15 DIAGNOSIS — E1142 Type 2 diabetes mellitus with diabetic polyneuropathy: Secondary | ICD-10-CM | POA: Diagnosis not present

## 2019-07-21 DIAGNOSIS — E113219 Type 2 diabetes mellitus with mild nonproliferative diabetic retinopathy with macular edema, unspecified eye: Secondary | ICD-10-CM | POA: Diagnosis not present

## 2019-07-21 DIAGNOSIS — Z0000 Encounter for general adult medical examination without abnormal findings: Secondary | ICD-10-CM | POA: Diagnosis not present

## 2019-07-21 DIAGNOSIS — E7800 Pure hypercholesterolemia, unspecified: Secondary | ICD-10-CM | POA: Diagnosis not present

## 2019-07-21 DIAGNOSIS — C61 Malignant neoplasm of prostate: Secondary | ICD-10-CM | POA: Diagnosis not present

## 2019-07-21 DIAGNOSIS — Z1389 Encounter for screening for other disorder: Secondary | ICD-10-CM | POA: Diagnosis not present

## 2019-07-21 DIAGNOSIS — E113213 Type 2 diabetes mellitus with mild nonproliferative diabetic retinopathy with macular edema, bilateral: Secondary | ICD-10-CM | POA: Diagnosis not present

## 2019-07-21 DIAGNOSIS — G4733 Obstructive sleep apnea (adult) (pediatric): Secondary | ICD-10-CM | POA: Diagnosis not present

## 2019-07-21 DIAGNOSIS — I10 Essential (primary) hypertension: Secondary | ICD-10-CM | POA: Diagnosis not present

## 2019-07-21 DIAGNOSIS — Z23 Encounter for immunization: Secondary | ICD-10-CM | POA: Diagnosis not present

## 2019-07-21 DIAGNOSIS — Z794 Long term (current) use of insulin: Secondary | ICD-10-CM | POA: Diagnosis not present

## 2019-07-21 DIAGNOSIS — E1142 Type 2 diabetes mellitus with diabetic polyneuropathy: Secondary | ICD-10-CM | POA: Diagnosis not present

## 2019-07-26 DIAGNOSIS — E7800 Pure hypercholesterolemia, unspecified: Secondary | ICD-10-CM | POA: Diagnosis not present

## 2019-07-26 DIAGNOSIS — E11621 Type 2 diabetes mellitus with foot ulcer: Secondary | ICD-10-CM | POA: Diagnosis not present

## 2019-07-26 DIAGNOSIS — E113213 Type 2 diabetes mellitus with mild nonproliferative diabetic retinopathy with macular edema, bilateral: Secondary | ICD-10-CM | POA: Diagnosis not present

## 2019-07-26 DIAGNOSIS — E1165 Type 2 diabetes mellitus with hyperglycemia: Secondary | ICD-10-CM | POA: Diagnosis not present

## 2019-07-26 DIAGNOSIS — I10 Essential (primary) hypertension: Secondary | ICD-10-CM | POA: Diagnosis not present

## 2019-07-26 DIAGNOSIS — E1142 Type 2 diabetes mellitus with diabetic polyneuropathy: Secondary | ICD-10-CM | POA: Diagnosis not present

## 2019-07-26 DIAGNOSIS — C61 Malignant neoplasm of prostate: Secondary | ICD-10-CM | POA: Diagnosis not present

## 2019-11-03 DIAGNOSIS — Z7984 Long term (current) use of oral hypoglycemic drugs: Secondary | ICD-10-CM | POA: Diagnosis not present

## 2019-11-03 DIAGNOSIS — E11621 Type 2 diabetes mellitus with foot ulcer: Secondary | ICD-10-CM | POA: Diagnosis not present

## 2019-11-03 DIAGNOSIS — E1142 Type 2 diabetes mellitus with diabetic polyneuropathy: Secondary | ICD-10-CM | POA: Diagnosis not present

## 2019-11-03 DIAGNOSIS — I10 Essential (primary) hypertension: Secondary | ICD-10-CM | POA: Diagnosis not present

## 2019-11-03 DIAGNOSIS — C61 Malignant neoplasm of prostate: Secondary | ICD-10-CM | POA: Diagnosis not present

## 2019-11-03 DIAGNOSIS — E1165 Type 2 diabetes mellitus with hyperglycemia: Secondary | ICD-10-CM | POA: Diagnosis not present

## 2019-11-03 DIAGNOSIS — E7800 Pure hypercholesterolemia, unspecified: Secondary | ICD-10-CM | POA: Diagnosis not present

## 2019-11-03 DIAGNOSIS — E113219 Type 2 diabetes mellitus with mild nonproliferative diabetic retinopathy with macular edema, unspecified eye: Secondary | ICD-10-CM | POA: Diagnosis not present

## 2019-11-19 DIAGNOSIS — I10 Essential (primary) hypertension: Secondary | ICD-10-CM | POA: Diagnosis not present

## 2019-11-19 DIAGNOSIS — G4733 Obstructive sleep apnea (adult) (pediatric): Secondary | ICD-10-CM | POA: Diagnosis not present

## 2019-11-19 DIAGNOSIS — E1142 Type 2 diabetes mellitus with diabetic polyneuropathy: Secondary | ICD-10-CM | POA: Diagnosis not present

## 2019-11-19 DIAGNOSIS — E113213 Type 2 diabetes mellitus with mild nonproliferative diabetic retinopathy with macular edema, bilateral: Secondary | ICD-10-CM | POA: Diagnosis not present

## 2019-12-08 DIAGNOSIS — C61 Malignant neoplasm of prostate: Secondary | ICD-10-CM | POA: Diagnosis not present

## 2019-12-10 DIAGNOSIS — G4733 Obstructive sleep apnea (adult) (pediatric): Secondary | ICD-10-CM | POA: Diagnosis not present

## 2019-12-15 DIAGNOSIS — N400 Enlarged prostate without lower urinary tract symptoms: Secondary | ICD-10-CM | POA: Diagnosis not present

## 2019-12-15 DIAGNOSIS — C61 Malignant neoplasm of prostate: Secondary | ICD-10-CM | POA: Diagnosis not present

## 2020-02-04 DIAGNOSIS — E11621 Type 2 diabetes mellitus with foot ulcer: Secondary | ICD-10-CM | POA: Diagnosis not present

## 2020-02-04 DIAGNOSIS — E1165 Type 2 diabetes mellitus with hyperglycemia: Secondary | ICD-10-CM | POA: Diagnosis not present

## 2020-02-04 DIAGNOSIS — E1142 Type 2 diabetes mellitus with diabetic polyneuropathy: Secondary | ICD-10-CM | POA: Diagnosis not present

## 2020-02-04 DIAGNOSIS — E7800 Pure hypercholesterolemia, unspecified: Secondary | ICD-10-CM | POA: Diagnosis not present

## 2020-02-04 DIAGNOSIS — I10 Essential (primary) hypertension: Secondary | ICD-10-CM | POA: Diagnosis not present

## 2020-02-04 DIAGNOSIS — C61 Malignant neoplasm of prostate: Secondary | ICD-10-CM | POA: Diagnosis not present

## 2020-02-24 DIAGNOSIS — E11621 Type 2 diabetes mellitus with foot ulcer: Secondary | ICD-10-CM | POA: Diagnosis not present

## 2020-02-24 DIAGNOSIS — C61 Malignant neoplasm of prostate: Secondary | ICD-10-CM | POA: Diagnosis not present

## 2020-02-24 DIAGNOSIS — E7800 Pure hypercholesterolemia, unspecified: Secondary | ICD-10-CM | POA: Diagnosis not present

## 2020-02-24 DIAGNOSIS — E1142 Type 2 diabetes mellitus with diabetic polyneuropathy: Secondary | ICD-10-CM | POA: Diagnosis not present

## 2020-02-24 DIAGNOSIS — E1165 Type 2 diabetes mellitus with hyperglycemia: Secondary | ICD-10-CM | POA: Diagnosis not present

## 2020-02-24 DIAGNOSIS — I10 Essential (primary) hypertension: Secondary | ICD-10-CM | POA: Diagnosis not present

## 2020-03-17 DIAGNOSIS — E1142 Type 2 diabetes mellitus with diabetic polyneuropathy: Secondary | ICD-10-CM | POA: Diagnosis not present

## 2020-03-17 DIAGNOSIS — I10 Essential (primary) hypertension: Secondary | ICD-10-CM | POA: Diagnosis not present

## 2020-03-17 DIAGNOSIS — M545 Low back pain: Secondary | ICD-10-CM | POA: Diagnosis not present

## 2020-03-28 ENCOUNTER — Encounter: Payer: Self-pay | Admitting: Podiatry

## 2020-03-28 ENCOUNTER — Other Ambulatory Visit: Payer: Self-pay

## 2020-03-28 ENCOUNTER — Ambulatory Visit: Payer: Medicare HMO | Admitting: Podiatry

## 2020-03-28 DIAGNOSIS — B351 Tinea unguium: Secondary | ICD-10-CM | POA: Diagnosis not present

## 2020-03-28 DIAGNOSIS — M79674 Pain in right toe(s): Secondary | ICD-10-CM | POA: Diagnosis not present

## 2020-03-28 DIAGNOSIS — M79675 Pain in left toe(s): Secondary | ICD-10-CM | POA: Diagnosis not present

## 2020-03-28 DIAGNOSIS — E1142 Type 2 diabetes mellitus with diabetic polyneuropathy: Secondary | ICD-10-CM

## 2020-03-28 DIAGNOSIS — E1140 Type 2 diabetes mellitus with diabetic neuropathy, unspecified: Secondary | ICD-10-CM | POA: Insufficient documentation

## 2020-03-28 NOTE — Progress Notes (Signed)
This patient presents to the office with chief complaint of long thick nails and diabetic feet.  This patient  says there  is  no pain and discomfort in his  feet.  This patient says there are long thick painful nails.  These nails are painful walking and wearing shoes.  Patient has no history of infection or drainage from both feet. Patient has history of foreign body injury left big toe.  Patient is unable to  self treat his own nails . This patient presents  to the office today for treatment of the  long nails and a foot evaluation due to history of  diabetes.  General Appearance  Alert, conversant and in no acute stress.  Vascular  Dorsalis pedis and posterior tibial  pulses are weakly  palpable  bilaterally.  Capillary return is within normal limits  bilaterally. Temperature is within normal limits  bilaterally.  Neurologic  Senn-Weinstein monofilament wire test diminished/absent   bilaterally. Muscle power within normal limits bilaterally.  Nails Thick disfigured discolored nails with subungual debris  from hallux to fifth toes bilaterally. No evidence of bacterial infection or drainage bilaterally.  Orthopedic  No limitations of motion of motion feet .  No crepitus or effusions noted.  No bony pathology or digital deformities noted.  DJD 1st MPJ  B/L.  Deviation left hallux at IPJ.  Skin  normotropic skin with no porokeratosis noted bilaterally.  No signs of infections or ulcers noted.     Onychomycosis  Diabetes with neuropathy.    Debride nails x 10.  A diabetic foot exam was performed and there is  evidence of  vascular or neurologic pathology. Patient qualifies for diabetic shoes.    RTC 3 months.   Gardiner Barefoot DPM

## 2020-04-12 DIAGNOSIS — G4733 Obstructive sleep apnea (adult) (pediatric): Secondary | ICD-10-CM | POA: Diagnosis not present

## 2020-05-16 ENCOUNTER — Inpatient Hospital Stay (HOSPITAL_COMMUNITY)
Admission: EM | Admit: 2020-05-16 | Discharge: 2020-05-18 | DRG: 378 | Disposition: A | Discharge status: Home / Self Care | Payer: Medicare HMO | Attending: Internal Medicine | Admitting: Internal Medicine

## 2020-05-16 ENCOUNTER — Emergency Department (HOSPITAL_COMMUNITY): Payer: Medicare HMO

## 2020-05-16 ENCOUNTER — Other Ambulatory Visit: Payer: Self-pay

## 2020-05-16 ENCOUNTER — Encounter (HOSPITAL_COMMUNITY): Payer: Self-pay

## 2020-05-16 DIAGNOSIS — A048 Other specified bacterial intestinal infections: Secondary | ICD-10-CM | POA: Diagnosis not present

## 2020-05-16 DIAGNOSIS — E11621 Type 2 diabetes mellitus with foot ulcer: Secondary | ICD-10-CM

## 2020-05-16 DIAGNOSIS — D649 Anemia, unspecified: Secondary | ICD-10-CM | POA: Diagnosis not present

## 2020-05-16 DIAGNOSIS — L97529 Non-pressure chronic ulcer of other part of left foot with unspecified severity: Secondary | ICD-10-CM | POA: Diagnosis not present

## 2020-05-16 DIAGNOSIS — E785 Hyperlipidemia, unspecified: Secondary | ICD-10-CM | POA: Diagnosis present

## 2020-05-16 DIAGNOSIS — J320 Chronic maxillary sinusitis: Secondary | ICD-10-CM | POA: Diagnosis not present

## 2020-05-16 DIAGNOSIS — Z79899 Other long term (current) drug therapy: Secondary | ICD-10-CM

## 2020-05-16 DIAGNOSIS — K254 Chronic or unspecified gastric ulcer with hemorrhage: Secondary | ICD-10-CM | POA: Diagnosis not present

## 2020-05-16 DIAGNOSIS — K259 Gastric ulcer, unspecified as acute or chronic, without hemorrhage or perforation: Secondary | ICD-10-CM | POA: Diagnosis not present

## 2020-05-16 DIAGNOSIS — I10 Essential (primary) hypertension: Secondary | ICD-10-CM | POA: Diagnosis not present

## 2020-05-16 DIAGNOSIS — D62 Acute posthemorrhagic anemia: Secondary | ICD-10-CM | POA: Diagnosis not present

## 2020-05-16 DIAGNOSIS — L97509 Non-pressure chronic ulcer of other part of unspecified foot with unspecified severity: Secondary | ICD-10-CM

## 2020-05-16 DIAGNOSIS — K922 Gastrointestinal hemorrhage, unspecified: Secondary | ICD-10-CM | POA: Diagnosis not present

## 2020-05-16 DIAGNOSIS — K921 Melena: Secondary | ICD-10-CM | POA: Diagnosis present

## 2020-05-16 DIAGNOSIS — Z20822 Contact with and (suspected) exposure to covid-19: Secondary | ICD-10-CM | POA: Diagnosis not present

## 2020-05-16 DIAGNOSIS — J3489 Other specified disorders of nose and nasal sinuses: Secondary | ICD-10-CM | POA: Diagnosis not present

## 2020-05-16 DIAGNOSIS — K264 Chronic or unspecified duodenal ulcer with hemorrhage: Secondary | ICD-10-CM | POA: Diagnosis not present

## 2020-05-16 DIAGNOSIS — K269 Duodenal ulcer, unspecified as acute or chronic, without hemorrhage or perforation: Secondary | ICD-10-CM | POA: Diagnosis not present

## 2020-05-16 DIAGNOSIS — S022XXA Fracture of nasal bones, initial encounter for closed fracture: Secondary | ICD-10-CM | POA: Diagnosis not present

## 2020-05-16 DIAGNOSIS — E119 Type 2 diabetes mellitus without complications: Secondary | ICD-10-CM

## 2020-05-16 DIAGNOSIS — E1140 Type 2 diabetes mellitus with diabetic neuropathy, unspecified: Secondary | ICD-10-CM | POA: Diagnosis not present

## 2020-05-16 DIAGNOSIS — Z7982 Long term (current) use of aspirin: Secondary | ICD-10-CM

## 2020-05-16 DIAGNOSIS — Z87891 Personal history of nicotine dependence: Secondary | ICD-10-CM | POA: Diagnosis not present

## 2020-05-16 DIAGNOSIS — B9681 Helicobacter pylori [H. pylori] as the cause of diseases classified elsewhere: Secondary | ICD-10-CM | POA: Diagnosis present

## 2020-05-16 DIAGNOSIS — K2950 Unspecified chronic gastritis without bleeding: Secondary | ICD-10-CM | POA: Diagnosis not present

## 2020-05-16 DIAGNOSIS — Z794 Long term (current) use of insulin: Secondary | ICD-10-CM | POA: Diagnosis not present

## 2020-05-16 DIAGNOSIS — G9389 Other specified disorders of brain: Secondary | ICD-10-CM | POA: Diagnosis not present

## 2020-05-16 DIAGNOSIS — D500 Iron deficiency anemia secondary to blood loss (chronic): Secondary | ICD-10-CM | POA: Diagnosis not present

## 2020-05-16 LAB — CBC
HCT: 22.5 % — ABNORMAL LOW (ref 39.0–52.0)
Hemoglobin: 7 g/dL — ABNORMAL LOW (ref 13.0–17.0)
MCH: 27.2 pg (ref 26.0–34.0)
MCHC: 31.1 g/dL (ref 30.0–36.0)
MCV: 87.5 fL (ref 80.0–100.0)
Platelets: 255 10*3/uL (ref 150–400)
RBC: 2.57 MIL/uL — ABNORMAL LOW (ref 4.22–5.81)
RDW: 13.4 % (ref 11.5–15.5)
WBC: 9 10*3/uL (ref 4.0–10.5)
nRBC: 0 % (ref 0.0–0.2)

## 2020-05-16 LAB — COMPREHENSIVE METABOLIC PANEL
ALT: 11 U/L (ref 0–44)
AST: 10 U/L — ABNORMAL LOW (ref 15–41)
Albumin: 3.2 g/dL — ABNORMAL LOW (ref 3.5–5.0)
Alkaline Phosphatase: 38 U/L (ref 38–126)
Anion gap: 8 (ref 5–15)
BUN: 32 mg/dL — ABNORMAL HIGH (ref 8–23)
CO2: 24 mmol/L (ref 22–32)
Calcium: 8.5 mg/dL — ABNORMAL LOW (ref 8.9–10.3)
Chloride: 106 mmol/L (ref 98–111)
Creatinine, Ser: 1.29 mg/dL — ABNORMAL HIGH (ref 0.61–1.24)
GFR calc Af Amer: >60 mL/min (ref >60)
GFR calc non Af Amer: 54 mL/min — ABNORMAL LOW (ref >60)
Glucose, Bld: 153 mg/dL — ABNORMAL HIGH (ref 70–99)
Potassium: 4.5 mmol/L (ref 3.5–5.1)
Sodium: 138 mmol/L (ref 135–145)
Total Bilirubin: 0.3 mg/dL (ref 0.3–1.2)
Total Protein: 5.8 g/dL — ABNORMAL LOW (ref 6.5–8.1)

## 2020-05-16 LAB — DIFFERENTIAL
Abs Immature Granulocytes: 0.07 10*3/uL (ref 0.00–0.07)
Basophils Absolute: 0 10*3/uL (ref 0.0–0.1)
Basophils Relative: 0 %
Eosinophils Absolute: 0.1 10*3/uL (ref 0.0–0.5)
Eosinophils Relative: 1 %
Immature Granulocytes: 1 %
Lymphocytes Relative: 18 %
Lymphs Abs: 1.7 10*3/uL (ref 0.7–4.0)
Monocytes Absolute: 0.7 10*3/uL (ref 0.1–1.0)
Monocytes Relative: 8 %
Neutro Abs: 6.5 10*3/uL (ref 1.7–7.7)
Neutrophils Relative %: 72 %

## 2020-05-16 LAB — ABO/RH: ABO/RH(D): B POS

## 2020-05-16 LAB — CBG MONITORING, ED: Glucose-Capillary: 159 mg/dL — ABNORMAL HIGH (ref 70–99)

## 2020-05-16 LAB — POC OCCULT BLOOD, ED: Fecal Occult Bld: POSITIVE — AB

## 2020-05-16 LAB — TROPONIN I (HIGH SENSITIVITY): Troponin I (High Sensitivity): 6 ng/L (ref <18)

## 2020-05-16 LAB — SARS CORONAVIRUS 2 BY RT PCR (HOSPITAL ORDER, PERFORMED IN ~~LOC~~ HOSPITAL LAB): SARS Coronavirus 2: NEGATIVE

## 2020-05-16 LAB — PREPARE RBC (CROSSMATCH)

## 2020-05-16 MED ORDER — SODIUM CHLORIDE 0.9 % IV SOLN
8.0000 mg/h | INTRAVENOUS | Status: DC
  Administered 2020-05-16 – 2020-05-17 (×2): 8 mg/h via INTRAVENOUS
  Filled 2020-05-16 (×2): qty 80

## 2020-05-16 MED ORDER — PANTOPRAZOLE SODIUM 40 MG IV SOLR
40.0000 mg | Freq: Once | INTRAVENOUS | Status: AC
  Administered 2020-05-16: 40 mg via INTRAVENOUS

## 2020-05-16 MED ORDER — ONDANSETRON HCL 4 MG PO TABS: 4.0000 mg | ORAL_TABLET | Freq: Four times a day (QID) | ORAL | Status: DC | PRN

## 2020-05-16 MED ORDER — ACETAMINOPHEN 650 MG RE SUPP: 650.0000 mg | Freq: Four times a day (QID) | RECTAL | Status: DC | PRN

## 2020-05-16 MED ORDER — ACETAMINOPHEN 325 MG PO TABS: 650.0000 mg | ORAL_TABLET | Freq: Four times a day (QID) | ORAL | Status: DC | PRN

## 2020-05-16 MED ORDER — PANTOPRAZOLE SODIUM 40 MG IV SOLR: 40.0000 mg | Freq: Two times a day (BID) | INTRAVENOUS | Status: DC

## 2020-05-16 MED ORDER — INSULIN ASPART 100 UNIT/ML ~~LOC~~ SOLN
0.0000 [IU] | SUBCUTANEOUS | Status: DC
  Administered 2020-05-17: 1 [IU] via SUBCUTANEOUS
  Administered 2020-05-17: 2 [IU] via SUBCUTANEOUS
  Administered 2020-05-17: 3 [IU] via SUBCUTANEOUS
  Administered 2020-05-17: 2 [IU] via SUBCUTANEOUS
  Administered 2020-05-17: 3 [IU] via SUBCUTANEOUS

## 2020-05-16 MED ORDER — ATORVASTATIN CALCIUM 10 MG PO TABS: 20.0000 mg | ORAL_TABLET | Freq: Every day | ORAL | Status: DC

## 2020-05-16 MED ORDER — SODIUM CHLORIDE 0.9 % IV SOLN
10.0000 mL/h | Freq: Once | INTRAVENOUS | Status: AC
  Administered 2020-05-17: 10 mL/h via INTRAVENOUS

## 2020-05-16 MED ORDER — ONDANSETRON HCL 4 MG/2ML IJ SOLN: 4.0000 mg | Freq: Four times a day (QID) | INTRAMUSCULAR | Status: DC | PRN

## 2020-05-16 MED ORDER — INSULIN GLARGINE 100 UNIT/ML ~~LOC~~ SOLN
20.0000 [IU] | Freq: Every day | SUBCUTANEOUS | Status: DC
  Administered 2020-05-16 – 2020-05-17 (×2): 20 [IU] via SUBCUTANEOUS
  Filled 2020-05-16 (×3): qty 0.2

## 2020-05-16 MED ORDER — TAMSULOSIN HCL 0.4 MG PO CAPS
0.4000 mg | ORAL_CAPSULE | Freq: Every day | ORAL | Status: DC
  Administered 2020-05-16 – 2020-05-17 (×2): 0.4 mg via ORAL
  Filled 2020-05-16 (×2): qty 1

## 2020-05-16 MED ORDER — PANTOPRAZOLE SODIUM 40 MG IV SOLR
40.0000 mg | Freq: Once | INTRAVENOUS | Status: DC
  Filled 2020-05-16: qty 40

## 2020-05-16 MED ORDER — SODIUM CHLORIDE 0.9% FLUSH
3.0000 mL | Freq: Once | INTRAVENOUS | Status: AC
  Administered 2020-05-16: 3 mL via INTRAVENOUS

## 2020-05-16 NOTE — ED Triage Notes (Signed)
Patient complains of 2 days of dizziness and chest pressure, describes the dizziness as positional. Patient alert and oriented, no drift. Speech clear. Denies chest pressure on assessment

## 2020-05-16 NOTE — H&P (Signed)
History and Physical    Martin Gordon NAT:[id number] DOB: 1946/06/17 DOA: 05/16/2020  PCP: Lavone Orn, MD  Patient coming from: Home  I have personally briefly reviewed patient's old medical records in [place]  Chief Complaint: dizziness, CP  HPI: Martin Gordon is a 74 y.o. male with medical history significant of DM2, HTN, HLD.  Pt presents to ED with c/o dizziness, myalgias, CP.  Symptoms onset 2 days ago, persistent, seems slightly better with ambulation.  Associated lightheadedness.  Today noticed that he had dark stools.  No abd pain, no N/V/D.  No h/o GIB previously.  Doesn't take NSAIDS chronically, though does take baby ASA daily.   ED Course: HGB 7.0.  Melena on exam.  GI consulted and hospitalist asked to admit.   Review of Systems: As per HPI, otherwise all review of systems negative.  Past Medical History:  Diagnosis Date  . Diabetes mellitus without complication ([place])    oral med and insulin use  . Hyperlipidemia   . Hypertension     Past Surgical History:  Procedure Laterality Date  . COLONOSCOPY WITH PROPOFOL N/A 09/24/2016   Procedure: COLONOSCOPY WITH PROPOFOL;  Surgeon: Garlan Fair, MD;  Location: [facility];  Service: Endoscopy;  Laterality: N/A;  . FLEXIBLE SIGMOIDOSCOPY N/A 06/17/2016   Procedure: FLEXIBLE SIGMOIDOSCOPY;  Surgeon: Garlan Fair, MD;  Location: [facility];  Service: Endoscopy;  Laterality: N/A;  Scope advanced to Sigmoid colon, Full colonoscopy aborted due to unsatisfactory prep.  Marland Kitchen HERNIA REPAIR     hx of   . PENILE PROSTHESIS IMPLANT N/A 07/04/2014   Procedure: PENILE PROTHESIS INFLATABLE, Three piece;  Surgeon: Claybon Jabs, MD;  Location: [place];  Service: Urology;  Laterality: N/A;     reports that he quit smoking about 25 years ago. His smoking use included cigarettes. He has a 60.00 pack-year smoking history. He has never used smokeless tobacco. He reports that he does not drink alcohol  and does not use drugs.  No Known Allergies  No family history on file. No sick contacts.  Prior to Admission medications   Medication Sig Start Date End Date Taking? Authorizing Provider  aspirin EC 81 MG tablet Take 81 mg by mouth every morning.   Yes [provider]  atorvastatin (LIPITOR) 20 MG tablet Take 20 mg by mouth daily. 05/06/20  Yes [provider]  insulin glargine (LANTUS) 100 UNIT/ML injection Inject 45 Units into the skin at bedtime.    Yes [provider]  lisinopril-hydrochlorothiazide (ZESTORETIC) 20-25 MG tablet Take 1 tablet by mouth daily. 03/16/20  Yes [provider]  metFORMIN (GLUCOPHAGE) 1000 MG tablet Take 1,000 mg by mouth 2 (two) times daily with a meal.   Yes [provider]  spironolactone (ALDACTONE) 25 MG tablet Take 25 mg by mouth 2 (two) times daily. 04/27/20  Yes [provider]  tamsulosin (FLOMAX) 0.4 MG CAPS capsule Take 0.4 mg by mouth at bedtime.  01/16/20  Yes [provider]  telmisartan (MICARDIS) 80 MG tablet Take 80 mg by mouth daily. 03/21/20  Yes [provider]    Physical Exam: Vitals:   05/16/20 1414 05/16/20 1635 05/16/20 2138  BP: (!) 139/52 (!) 136/57 138/62  Pulse: 76 70 92  Resp: 18 16 14   Temp: 98.6 F (37 C)    TempSrc: Oral    SpO2: 100% 99% 100%    Constitutional: NAD, calm, comfortable Eyes: PERRL, lids and conjunctivae normal ENMT: Mucous membranes are  moist. Posterior pharynx clear of any exudate or lesions.Normal dentition.  Neck: normal, supple, no masses, no thyromegaly Respiratory: clear to auscultation bilaterally, no wheezing, no crackles. Normal respiratory effort. No accessory muscle use.  Cardiovascular: Regular rate and rhythm, no murmurs / rubs / gallops. No extremity edema. 2+ pedal pulses. No carotid bruits.  Abdomen: no tenderness, no masses palpated. No hepatosplenomegaly. Bowel sounds positive.  Musculoskeletal: no clubbing / cyanosis.  No joint deformity upper and lower extremities. Good ROM, no contractures. Normal muscle tone.  Skin: no rashes, lesions, ulcers. No induration Neurologic: CN 2-12 grossly intact. Sensation intact, DTR normal. Strength 5/5 in all 4.  Psychiatric: Normal judgment and insight. Alert and oriented x 3. Normal mood.    Labs on Admission: I have personally reviewed following labs and imaging studies  CBC: Recent Labs  Lab 05/16/20 1419  WBC 9.0  NEUTROABS 6.5  HGB 7.0*  HCT 22.5*  MCV 87.5  PLT 761   Basic Metabolic Panel: Recent Labs  Lab 05/16/20 1419  NA 138  K 4.5  CL 106  CO2 24  GLUCOSE 153*  BUN 32*  CREATININE 1.29*  CALCIUM 8.5*   GFR: CrCl cannot be calculated (Unknown ideal weight.). Liver Function Tests: Recent Labs  Lab 05/16/20 1419  AST 10*  ALT 11  ALKPHOS 38  BILITOT 0.3  PROT 5.8*  ALBUMIN 3.2*   No results for input(s): LIPASE, AMYLASE in the last 168 hours. No results for input(s): AMMONIA in the last 168 hours. Coagulation Profile: No results for input(s): INR, PROTIME in the last 168 hours. Cardiac Enzymes: No results for input(s): CKTOTAL, CKMB, CKMBINDEX, TROPONINI in the last 168 hours. BNP (last 3 results) No results for input(s): PROBNP in the last 8760 hours. HbA1C: No results for input(s): HGBA1C in the last 72 hours. CBG: Recent Labs  Lab 05/16/20 2159  GLUCAP 159*   Lipid Profile: No results for input(s): CHOL, HDL, LDLCALC, TRIG, CHOLHDL, LDLDIRECT in the last 72 hours. Thyroid Function Tests: No results for input(s): TSH, T4TOTAL, FREET4, T3FREE, THYROIDAB in the last 72 hours. Anemia Panel: No results for input(s): VITAMINB12, FOLATE, FERRITIN, TIBC, IRON, RETICCTPCT in the last 72 hours. Urine analysis:    Component Value Date/Time   COLORURINE STRAW (A) 07/14/2018 2138   APPEARANCEUR CLEAR 07/14/2018 2138   LABSPEC 1.008 07/14/2018 2138   PHURINE 5.0 07/14/2018 2138   GLUCOSEU NEGATIVE 07/14/2018 2138   HGBUR  LARGE (A) 07/14/2018 2138   BILIRUBINUR NEGATIVE 07/14/2018 2138   [place] NEGATIVE 07/14/2018 2138   PROTEINUR NEGATIVE 07/14/2018 2138   NITRITE NEGATIVE 07/14/2018 2138   LEUKOCYTESUR NEGATIVE 07/14/2018 2138    Radiological Exams on Admission: CT HEAD WO CONTRAST  Result Date: 05/16/2020 CLINICAL DATA:  Dizziness EXAM: CT HEAD WITHOUT CONTRAST TECHNIQUE: Contiguous axial images were obtained from the base of the skull through the vertex without intravenous contrast. COMPARISON:  None. FINDINGS: Brain: Normal anatomic configuration. Mild parenchymal volume loss is commensurate with the patient's age. Mild periventricular and subcortical white matter changes are present likely reflecting the sequela of small vessel ischemia. No abnormal intra or extra-axial mass lesion or fluid collection. No abnormal mass effect or midline shift. No evidence of acute intracranial hemorrhage or infarct. Ventricular size is normal. Cerebellum unremarkable. Vascular: Unremarkable Skull: Intact Sinuses/Orbits: There is mild mucosal thickening within the maxillary sinuses bilaterally, right greater than left. Remaining paranasal sinuses are clear. Orbits are unremarkable. Other: Mastoid air cells and middle ear cavities are clear. Remote fracture of  the nasal bones is noted. IMPRESSION: 1. No acute intracranial abnormality. 2. Mild maxillary sinus disease. Electronically Signed   By: Fidela Salisbury MD   On: 05/16/2020 15:42    EKG: Independently reviewed.  Assessment/Plan Principal Problem:   Gastrointestinal hemorrhage with melena Active Problems:   DM2 (diabetes mellitus, type 2) (HCC)   Symptomatic anemia    1. GIB with melena - 1. EDP spoke with Dr. Alessandra Bevels 1. NPO after MN 2. Start PPI 3. They will see tomorrow in AM 2. Tele monitor 3. Transfusion as below 4. Hold ASA 2. Symptomatic anemia - 1. 1u PRBC transfusion 2. Repeat CBC in AM 3. Tele monitor 3. DM2 - 1. Hold home  metformin 2. Lantus 20u QHS (takes 45 at home) 3. Sensitive SSI Q4H 4. HTN - 1. Holding home BP meds  DVT prophylaxis: SCDs Code Status: Full Family Communication: No family in room Disposition Plan: Home after GIB controlled and HGB stabilized Consults called: [facility] contacted by EDP, sounds like EGD tomorrow most likely Admission status: Admit to inpatient  Severity of Illness: The appropriate patient status for this patient is INPATIENT. Inpatient status is judged to be reasonable and necessary in order to provide the required intensity of service to ensure the patient's safety. The patient's presenting symptoms, physical exam findings, and initial radiographic and laboratory data in the context of their chronic comorbidities is felt to place them at high risk for further clinical deterioration. Furthermore, it is not anticipated that the patient will be medically stable for discharge from the hospital within 2 midnights of admission. The following factors support the patient status of inpatient.   IP status due to GIB with melena, HGB of 7 and symptomatic anemia.   * I certify that at the point of admission it is my clinical judgment that the patient will require inpatient hospital care spanning beyond 2 midnights from the point of admission due to high intensity of service, high risk for further deterioration and high frequency of surveillance required.*    Chablis Losh M. DO [facility]  How to contact the [facility] Attending or Consulting provider [place] or covering provider during after hours [place], for this patient?  1. Check the care team in [facility] and look for a) attending/consulting [facility] provider listed and b) the [facility] team listed 2. Log into [email or web address]  [employer] Physician Scheduling and messaging for groups and whole hospitals  On call and physician scheduling software for group practices, residents, hospitalists and other medical providers for call, clinic, rotation and  shift schedules. On[employer] is a hospital-wide system for scheduling doctors and paging doctors on call. EasyPlot is for scientific plotting and data analysis.  [email or web address]  and use [place] universal password to access. If you do not have the password, please contact the hospital operator.  3. Locate the [facility] provider you are looking for under [facility] and page to a number that you can be directly reached. 4. If you still have difficulty reaching the provider, please page the [facility] (Director on Call) for the Hospitalists listed on amion for assistance.  05/16/2020, 10:10 PM

## 2020-05-16 NOTE — ED Provider Notes (Signed)
[facility] DEPART[facility] Provider Note   CSN: [id number] Arrival date & time: 05/16/20  1313     History No chief complaint on file.   Martin Gordon is a 74 y.o. male with a past medical history of diabetes, hypertension, hyperlipidemia presenting to the ED with chief complaint of dizziness, myalgias and chest pressure.  Symptoms have been going on for the past 2 days.  States that symptoms improve when he ambulates and worsens when he rests.  He reports feeling "lightheaded and just not right."  Noticed today that he had dark-colored stools.  Denies any changes to activity or appetite level.  No abdominal pain, nausea, vomiting or diarrhea.  Denies chest pain currently.  Denies any shortness of breath, headache.  Does report generalized weakness with ambulation as well.  Denies any focal weakness or numbness, hemoptysis, leg swelling, history of GI bleed, anticoagulant use. Last colonoscopy was within the past 5 years and was told that it was normal other than "some polyps that they removed."  Does not take aspirin or NSAIDs chronically. He has never been told in the past that he was anemic.  Does not take any iron supplements.  HPI     Past Medical History:  Diagnosis Date  . Diabetes mellitus without complication ([place])    oral med and insulin use  . Hyperlipidemia   . Hypertension     Patient Active Problem List   Diagnosis Date Noted  . Pain due to onychomycosis of toenails of both feet 03/28/2020  . Diabetic neuropathy ([place]) 03/28/2020  . Diabetic ulcer of left great toe ([place]) 02/21/2017  . Subacute osteomyelitis of left foot ([place]) 02/21/2017  . Erectile dysfunction 07/04/2014    Past Surgical History:  Procedure Laterality Date  . COLONOSCOPY WITH PROPOFOL N/A 09/24/2016   Procedure: COLONOSCOPY WITH PROPOFOL;  Surgeon: Garlan Fair, MD;  Location: [facility];  Service: Endoscopy;  Laterality: N/A;  . FLEXIBLE SIGMOIDOSCOPY N/A 06/17/2016    Procedure: FLEXIBLE SIGMOIDOSCOPY;  Surgeon: Garlan Fair, MD;  Location: [facility];  Service: Endoscopy;  Laterality: N/A;  Scope advanced to Sigmoid colon, Full colonoscopy aborted due to unsatisfactory prep.  Marland Kitchen HERNIA REPAIR     hx of   . PENILE PROSTHESIS IMPLANT N/A 07/04/2014   Procedure: PENILE PROTHESIS INFLATABLE, Three piece;  Surgeon: Claybon Jabs, MD;  Location: [facility];  Service: Urology;  Laterality: N/A;       No family history on file.  Social History   Tobacco Use  . Smoking status: Former Smoker    Packs/day: 2.00    Years: 30.00    Pack years: 60.00    Types: Cigarettes    Quit date: 09/16/1994    Years since quitting: 25.6  . Smokeless tobacco: Never Used  Vaping Use  . Vaping Use: Never used  Substance Use Topics  . Alcohol use: No    Comment: past hx stopped in 1993  . Drug use: No    Home Medications Prior to Admission medications   Medication Sig Start Date End Date Taking? Authorizing Provider  aspirin EC 81 MG tablet Take 81 mg by mouth every morning.   Yes [provider]  atorvastatin (LIPITOR) 20 MG tablet Take 20 mg by mouth daily. 05/06/20  Yes [provider]  insulin glargine (LANTUS) 100 UNIT/ML injection Inject 45 Units into the skin at bedtime.    Yes [provider]  lisinopril-hydrochlorothiazide (ZESTORETIC) 20-25 MG tablet Take 1  tablet by mouth daily. 03/16/20  Yes [provider]  metFORMIN (GLUCOPHAGE) 1000 MG tablet Take 1,000 mg by mouth 2 (two) times daily with a meal.   Yes [provider]  spironolactone (ALDACTONE) 25 MG tablet Take 25 mg by mouth 2 (two) times daily. 04/27/20  Yes [provider]  tamsulosin (FLOMAX) 0.4 MG CAPS capsule Take 0.4 mg by mouth at bedtime.  01/16/20  Yes [provider]  telmisartan (MICARDIS) 80 MG tablet Take 80 mg by mouth daily. 03/21/20  Yes [provider]    Allergies    Patient has no known  allergies.  Review of Systems   Review of Systems  Constitutional: Positive for fatigue. Negative for appetite change, chills and fever.  HENT: Negative for ear pain, rhinorrhea, sneezing and sore throat.   Eyes: Negative for photophobia and visual disturbance.  Respiratory: Negative for cough, chest tightness, shortness of breath and wheezing.   Cardiovascular: Positive for chest pain. Negative for palpitations.  Gastrointestinal: Positive for blood in stool. Negative for abdominal pain, constipation, diarrhea, nausea and vomiting.  Genitourinary: Negative for dysuria, hematuria and urgency.  Musculoskeletal: Positive for myalgias.  Skin: Negative for rash.  Neurological: Positive for dizziness and light-headedness. Negative for weakness.    Physical Exam Updated Vital Signs BP (!) 136/57 (BP Location: Right Arm)   Pulse 70   Temp 98.6 F (37 C) (Oral)   Resp 16   SpO2 99%   Physical Exam Vitals and nursing note reviewed. Exam conducted with a chaperone present.  Constitutional:      General: He is not in acute distress.    Appearance: He is well-developed.  HENT:     Head: Normocephalic and atraumatic.     Nose: Nose normal.  Eyes:     General: No scleral icterus.       Right eye: No discharge.        Left eye: No discharge.     Conjunctiva/sclera: Conjunctivae normal.     Pupils: Pupils are equal, round, and reactive to light.  Cardiovascular:     Rate and Rhythm: Normal rate and regular rhythm.     Heart sounds: Normal heart sounds. No murmur heard.  No friction rub. No gallop.   Pulmonary:     Effort: Pulmonary effort is normal. No respiratory distress.     Breath sounds: Normal breath sounds.  Abdominal:     General: Bowel sounds are normal. There is no distension.     Palpations: Abdomen is soft.     Tenderness: There is no abdominal tenderness. There is no guarding.  Genitourinary:    Comments: DRE with black stool on exam. No tenderness. Musculoskeletal:         General: Normal range of motion.     Cervical back: Normal range of motion and neck supple.  Skin:    General: Skin is warm and dry.     Findings: No rash.  Neurological:     General: No focal deficit present.     Mental Status: He is alert and oriented to person, place, and time.     Cranial Nerves: No cranial nerve deficit.     Sensory: No sensory deficit.     Motor: No weakness or abnormal muscle tone.     Coordination: Coordination normal.     Comments: Pupils reactive. No facial asymmetry noted. Cranial nerves appear grossly intact. Sensation intact to light touch on face, BUE and BLE. Strength 5/5 in BUE and BLE.  ED Results / Procedures / Treatments   Labs (all labs ordered are listed, but only abnormal results are displayed) Labs Reviewed  CBC - Abnormal; Notable for the following components:      Result Value   RBC 2.57 (*)    Hemoglobin 7.0 (*)    HCT 22.5 (*)    All other components within normal limits  COMPREHENSIVE METABOLIC PANEL - Abnormal; Notable for the following components:   Glucose, Bld 153 (*)    BUN 32 (*)    Creatinine, Ser 1.29 (*)    Calcium 8.5 (*)    Total Protein 5.8 (*)    Albumin 3.2 (*)    AST 10 (*)    GFR calc non Af Amer 54 (*)    All other components within normal limits  SARS CORONAVIRUS 2 BY RT PCR (HOSPITAL ORDER, [facility])  DIFFERENTIAL  POC OCCULT BLOOD, ED  TYPE AND SCREEN  PREPARE RBC (CROSSMATCH)  TROPONIN I (HIGH SENSITIVITY)    EKG EKG Interpretation  Date/Time:  Tuesday May 16 2020 14:13:46 EDT Ventricular Rate:  89 PR Interval:  136 QRS Duration: 86 QT Interval:  382 QTC Calculation: 464 R Axis:   -20 Text Interpretation: Sinus rhythm with Premature atrial complexes Minimal voltage criteria for LVH, may be normal variant ( R in aVL ) Borderline ECG When comparaed to prior, similar apperance. No STEMI Confirmed by Antony Blackbird [phone]) on 05/16/2020 8:17:31  PM   Radiology CT HEAD WO CONTRAST  Result Date: 05/16/2020 CLINICAL DATA:  Dizziness EXAM: CT HEAD WITHOUT CONTRAST TECHNIQUE: Contiguous axial images were obtained from the base of the skull through the vertex without intravenous contrast. COMPARISON:  None. FINDINGS: Brain: Normal anatomic configuration. Mild parenchymal volume loss is commensurate with the patient's age. Mild periventricular and subcortical white matter changes are present likely reflecting the sequela of small vessel ischemia. No abnormal intra or extra-axial mass lesion or fluid collection. No abnormal mass effect or midline shift. No evidence of acute intracranial hemorrhage or infarct. Ventricular size is normal. Cerebellum unremarkable. Vascular: Unremarkable Skull: Intact Sinuses/Orbits: There is mild mucosal thickening within the maxillary sinuses bilaterally, right greater than left. Remaining paranasal sinuses are clear. Orbits are unremarkable. Other: Mastoid air cells and middle ear cavities are clear. Remote fracture of the nasal bones is noted. IMPRESSION: 1. No acute intracranial abnormality. 2. Mild maxillary sinus disease. Electronically Signed   By: Fidela Salisbury MD   On: 05/16/2020 15:42    Procedures .Critical Care Performed by: Delia Heady, PA-C Authorized by: Delia Heady, PA-C   Critical care provider statement:    Critical care time (minutes):  35   Critical care time was exclusive of:  Separately billable procedures and treating other patients   Critical care was necessary to treat or prevent imminent or life-threatening deterioration of the following conditions:  Shock, circulatory failure and dehydration   Critical care was time spent personally by me on the following activities:  Development of treatment plan with patient or surrogate, discussions with consultants, evaluation of patient's response to treatment, examination of patient, ordering and performing treatments and interventions, ordering  and review of laboratory studies, ordering and review of radiographic studies, re-evaluation of patient's condition and review of old charts   I assumed direction of critical care for this patient from another provider in my specialty: no     (including critical care time)  Medications Ordered in ED Medications  sodium chloride flush (NS) 0.9 %  injection 3 mL (has no administration in time range)  pantoprazole (PROTONIX) injection 40 mg (has no administration in time range)  0.9 %  sodium chloride infusion (has no administration in time range)    ED Course  I have reviewed the triage vital signs and the nursing notes.  Pertinent labs & imaging results that were available during my care of the patient were reviewed by me and considered in my medical decision making (see chart for details).    MDM Rules/Calculators/A&P                          74 year old male with past medical history of diabetes, hypertension, hyperlipidemia presenting to the ED with a chief complaint of dizziness, myalgias and chest pressure for the past 2 days.  Today noticed black stools when having a bowel movement.  No abdominal pain, nausea, vomiting or diarrhea.  No history of GI bleed in the past, does not take any anticoagulant medications.  Last colonoscopy was in 2018 was told that there were some polyps that were removed but otherwise unremarkable.  Does not take NSAIDs chronically.  No history of ulcers, no history of anemia that he is aware of.  On exam patient is overall well-appearing.  No neurological deficits noted.  Abdomen is soft, nontender nondistended.  Lab work significant for hemoglobin of 7, no recent for comparison although he does not report a history of anemia is not on iron supplements.  Rectal exam with black stool noted.  No tenderness.  Creatinine elevated 1.29 with a BUN of 32.  EKG without any ischemic changes, no changes from prior tracings.  CT of the head without any acute abnormalities  causes dizziness.  I suspect all of his symptoms are due to his acute anemia.  He will need to be admitted, started on Protonix, transfuse 1 unit of blood.  I have consulted [facility], Dr. Alessandra Bevels through secure message had not added him to the treatment team.  Dr. Watt Climes asked that we make him n.p.o. after midnight.  Will admit to medicine service.    Portions of this note were generated with [employer]. Dictation errors may occur despite best attempts at proofreading.  Final Clinical Impression(s) / ED Diagnoses Final diagnoses:  Gastrointestinal hemorrhage, unspecified gastrointestinal hemorrhage type    Rx / DC Orders ED Discharge Orders    None       Delia Heady, PA-C 05/16/20 2117    Tegeler, Gwenyth Allegra, MD 05/17/20 1143

## 2020-05-17 ENCOUNTER — Encounter (HOSPITAL_COMMUNITY)
Admission: EM | Disposition: A | Discharge status: Home / Self Care | Payer: Self-pay | Source: Home / Self Care | Attending: Internal Medicine

## 2020-05-17 ENCOUNTER — Encounter (HOSPITAL_COMMUNITY): Payer: Self-pay | Admitting: Internal Medicine

## 2020-05-17 ENCOUNTER — Inpatient Hospital Stay (HOSPITAL_COMMUNITY): Payer: Medicare HMO | Admitting: Anesthesiology

## 2020-05-17 HISTORY — PX: ESOPHAGOGASTRODUODENOSCOPY: SHX5428

## 2020-05-17 HISTORY — PX: BIOPSY: SHX5522

## 2020-05-17 LAB — GLUCOSE, CAPILLARY
Glucose-Capillary: 210 mg/dL — ABNORMAL HIGH (ref 70–99)
Glucose-Capillary: 165 mg/dL — ABNORMAL HIGH (ref 70–99)
Glucose-Capillary: 70 mg/dL (ref 70–99)
Glucose-Capillary: 175 mg/dL — ABNORMAL HIGH (ref 70–99)
Glucose-Capillary: 243 mg/dL — ABNORMAL HIGH (ref 70–99)
Glucose-Capillary: 135 mg/dL — ABNORMAL HIGH (ref 70–99)

## 2020-05-17 LAB — TYPE AND SCREEN
ABO/RH(D): B POS
Antibody Screen: NEGATIVE
Unit division: 0

## 2020-05-17 LAB — BPAM RBC
Blood Product Expiration Date: 202109162359
ISSUE DATE / TIME: 202108312327
Unit Type and Rh: 7300

## 2020-05-17 LAB — CBC
HCT: 23.7 % — ABNORMAL LOW (ref 39.0–52.0)
Hemoglobin: 7.4 g/dL — ABNORMAL LOW (ref 13.0–17.0)
MCH: 27 pg (ref 26.0–34.0)
MCHC: 31.2 g/dL (ref 30.0–36.0)
MCV: 86.5 fL (ref 80.0–100.0)
Platelets: 236 10*3/uL (ref 150–400)
RBC: 2.74 MIL/uL — ABNORMAL LOW (ref 4.22–5.81)
RDW: 13.6 % (ref 11.5–15.5)
WBC: 7.2 10*3/uL (ref 4.0–10.5)
nRBC: 0 % (ref 0.0–0.2)

## 2020-05-17 LAB — BASIC METABOLIC PANEL
Anion gap: 11 (ref 5–15)
BUN: 26 mg/dL — ABNORMAL HIGH (ref 8–23)
CO2: 21 mmol/L — ABNORMAL LOW (ref 22–32)
Calcium: 8.5 mg/dL — ABNORMAL LOW (ref 8.9–10.3)
Chloride: 105 mmol/L (ref 98–111)
Creatinine, Ser: 1.1 mg/dL (ref 0.61–1.24)
GFR calc Af Amer: >60 mL/min (ref >60)
GFR calc non Af Amer: >60 mL/min (ref >60)
Glucose, Bld: 126 mg/dL — ABNORMAL HIGH (ref 70–99)
Potassium: 4.1 mmol/L (ref 3.5–5.1)
Sodium: 137 mmol/L (ref 135–145)

## 2020-05-17 LAB — HEMOGLOBIN A1C
Hgb A1c MFr Bld: 6.7 % — ABNORMAL HIGH (ref 4.8–5.6)
Mean Plasma Glucose: 145.59 mg/dL

## 2020-05-17 LAB — CBG MONITORING, ED: Glucose-Capillary: 203 mg/dL — ABNORMAL HIGH (ref 70–99)

## 2020-05-17 SURGERY — EGD (ESOPHAGOGASTRODUODENOSCOPY)
Anesthesia: Monitor Anesthesia Care

## 2020-05-17 MED ORDER — PHENYLEPHRINE 40 MCG/ML (10ML) SYRINGE FOR IV PUSH (FOR BLOOD PRESSURE SUPPORT)
PREFILLED_SYRINGE | INTRAVENOUS | Status: DC | PRN
  Administered 2020-05-17: 60 ug via INTRAVENOUS
  Administered 2020-05-17: 80 ug via INTRAVENOUS

## 2020-05-17 MED ORDER — POLYETHYLENE GLYCOL 3350 17 G PO PACK: 17.0000 g | PACK | Freq: Every day | ORAL | Status: DC | PRN

## 2020-05-17 MED ORDER — LIDOCAINE HCL (CARDIAC) PF 100 MG/5ML IV SOSY
PREFILLED_SYRINGE | INTRAVENOUS | Status: DC | PRN
  Administered 2020-05-17: 100 mg via INTRAVENOUS

## 2020-05-17 MED ORDER — SENNOSIDES-DOCUSATE SODIUM 8.6-50 MG PO TABS: 2.0000 | ORAL_TABLET | Freq: Every evening | ORAL | Status: DC | PRN

## 2020-05-17 MED ORDER — PROPOFOL 500 MG/50ML IV EMUL
INTRAVENOUS | Status: DC | PRN
  Administered 2020-05-17: 140 ug/kg/min via INTRAVENOUS

## 2020-05-17 MED ORDER — LACTATED RINGERS IV SOLN
INTRAVENOUS | Status: AC | PRN
  Administered 2020-05-17: 1000 mL via INTRAVENOUS

## 2020-05-17 MED ORDER — PANTOPRAZOLE SODIUM 40 MG PO TBEC
40.0000 mg | DELAYED_RELEASE_TABLET | Freq: Two times a day (BID) | ORAL | Status: DC
  Administered 2020-05-17 – 2020-05-18 (×2): 40 mg via ORAL
  Filled 2020-05-17 (×2): qty 1

## 2020-05-17 MED ORDER — PROPOFOL 10 MG/ML IV BOLUS
INTRAVENOUS | Status: DC | PRN
  Administered 2020-05-17: 20 mg via INTRAVENOUS

## 2020-05-17 NOTE — Anesthesia Preprocedure Evaluation (Addendum)
Anesthesia Evaluation  Patient identified by MRN, date of birth, ID band Patient awake    Reviewed: Allergy & Precautions, NPO status , Patient's Chart, lab work & pertinent test results  Airway Mallampati: II  TM Distance: >3 FB     Dental   Pulmonary former smoker,    breath sounds clear to auscultation       Cardiovascular hypertension,  Rhythm:Regular Rate:Normal     Neuro/Psych    GI/Hepatic Neg liver ROS, History noted. CG   Endo/Other  diabetes  Renal/GU negative Renal ROS     Musculoskeletal   Abdominal   Peds  Hematology  (+) anemia ,   Anesthesia Other Findings   Reproductive/Obstetrics                            Anesthesia Physical Anesthesia Plan  ASA: III  Anesthesia Plan: MAC   Post-op Pain Management:    Induction: Intravenous  PONV Risk Score and Plan: 1 and Propofol infusion  Airway Management Planned: Nasal Cannula and Simple Face Mask  Additional Equipment:   Intra-op Plan:   Post-operative Plan:   Informed Consent: I have reviewed the patients History and Physical, chart, labs and discussed the procedure including the risks, benefits and alternatives for the proposed anesthesia with the patient or authorized representative who has indicated his/her understanding and acceptance.     Dental advisory given  Plan Discussed with: Anesthesiologist  Anesthesia Plan Comments:         Anesthesia Quick Evaluation

## 2020-05-17 NOTE — Transfer of Care (Signed)
Immediate Anesthesia Transfer of Care Note  Patient: Demetreus Lothamer  Procedure(s) Performed: ESOPHAGOGASTRODUODENOSCOPY (EGD) (N/A ) BIOPSY  Patient Location: Endoscopy Unit  Anesthesia Type:MAC  Level of Consciousness: awake, alert  and oriented  Airway & Oxygen Therapy: Patient Spontanous Breathing and Patient connected to nasal cannula oxygen  Post-op Assessment: Report given to RN, Post -op Vital signs reviewed and stable and Patient moving all extremities X 4  Post vital signs: Reviewed and stable  Last Vitals:  Vitals Value Taken Time  BP    Temp    Pulse 71 05/17/20 1152  Resp 21 05/17/20 1152  SpO2 95 % 05/17/20 1152  Vitals shown include unvalidated device data.  Last Pain:  Vitals:   05/17/20 1029  TempSrc: Oral  PainSc: 0-No pain         Complications: No complications documented.

## 2020-05-17 NOTE — Op Note (Signed)
[facility] Patient Name: Martin Gordon Procedure Date : 05/17/2020 MRN: [id number] Attending MD: Clarene Essex , MD Date of Birth: [birth year] CSN: [id number] Age: 74 Admit Type: Inpatient Procedure:                Upper GI endoscopy Indications:              Acute post hemorrhagic anemia, Melena Providers:                Clarene Essex, MD, Clyde Lundborg, RN, Janie Billups,                            Technician, Virgilio Belling. Beckner, CRNA Referring MD:              Medicines:                Propofol total dose 176 mg IV, 100 mg IV lidocaine Complications:            No immediate complications. Estimated Blood Loss:     Estimated blood loss: none. Procedure:                Pre-Anesthesia Assessment:                           - Prior to the procedure, a History and Physical                            was performed, and patient medications and                            allergies were reviewed. The patient's tolerance of                            previous anesthesia was also reviewed. The risks                            and benefits of the procedure and the sedation                            options and risks were discussed with the patient.                            All questions were answered, and informed consent                            was obtained. Prior Anticoagulants: The patient has                            taken no previous anticoagulant or antiplatelet                            agents except for aspirin. ASA Grade Assessment: II                            - A patient with mild systemic disease. After  reviewing the risks and benefits, the patient was                            deemed in satisfactory condition to undergo the                            procedure.                           After obtaining informed consent, the endoscope was                            passed under direct vision. Throughout the                             procedure, the patient's blood pressure, pulse, and                            oxygen saturations were monitored continuously. The                            [id number]-H190 ([id number] Olympus gastroscope was                            introduced through the mouth, and advanced to the                            third part of duodenum. The upper GI endoscopy was                            accomplished without difficulty. The patient                            tolerated the procedure well. Scope In: Scope Out: Findings:      The examined esophagus was normal.      Few non-bleeding superficial gastric ulcers with no stigmata of bleeding       were found in the gastric antrum. Biopsies were taken with a cold       forceps for histology of antrum and fundus to rule out H. pylori.      Few non-bleeding semi-cratered, linear and superficial duodenal ulcers       with no stigmata of bleeding were found in the duodenal bulb.      The second portion of the duodenum and third portion of the duodenum       were normal.      The exam was otherwise without abnormality. Impression:               - Normal esophagus.                           - Non-bleeding gastric ulcers with no stigmata of                            bleeding. Biopsied.                           -  Non-bleeding duodenal ulcers with no stigmata of                            bleeding.                           - Normal second portion of the duodenum and third                            portion of the duodenum.                           - The examination was otherwise normal. Recommendation:           - Soft diet today. Hopefully home soon if                            hemoglobin stable and might need another unit of                            blood                           - Use Protonix (pantoprazole) 40 mg PO BID today                            and while in the hospital but can discharge on once                            a day. And hold  his baby aspirin for 2-4 weeks and                            coach him not to use nonsteroidals and use Tylenol                            only                           - Await pathology results treat H. pylori at some                            point in the future if positive.                           - Return to GI clinic in 2 weeks and to recheck                            symptoms CBC.                           - Telephone GI clinic for pathology results in 1                            week.                           -  Telephone GI clinic if symptomatic PRN. Procedure Code(s):        --- Professional ---                           [phone], Esophagogastroduodenoscopy, flexible,                            transoral; with biopsy, single or multiple Diagnosis Code(s):        --- Professional ---                           K25.9, Gastric ulcer, unspecified as acute or                            chronic, without hemorrhage or perforation                           K26.9, Duodenal ulcer, unspecified as acute or                            chronic, without hemorrhage or perforation                           D62, Acute posthemorrhagic anemia                           K92.1, Melena (includes Hematochezia) CPT copyright 2019 [employer]. All rights reserved. The codes documented in this report are preliminary and upon coder review may  be revised to meet current compliance requirements. Clarene Essex, MD 05/17/2020 11:57:16 AM This report has been signed electronically. Number of Addenda: 0

## 2020-05-17 NOTE — Anesthesia Procedure Notes (Signed)
Procedure Name: MAC Date/Time: 05/17/2020 11:32 AM Performed by: Mariea Clonts, CRNA Pre-anesthesia Checklist: Patient identified, Emergency Drugs available, Suction available, Patient being monitored and Timeout performed Patient Re-evaluated:Patient Re-evaluated prior to induction Oxygen Delivery Method: Nasal cannula

## 2020-05-17 NOTE — Consult Note (Signed)
Referring Provider: ED Primary Care Physician:  Lavone Orn, MD Primary Gastroenterologist:  [facility] (previous patient of Dr. Wynetta Emery)  Reason for Consultation:  Suspected Upper GI bleeding  HPI: Martin Gordon is a 74 y.o. male with past medical history of DM type 2 presenting with suspected upper GI bleeding.  Patient was in his usual state of health until 4 days ago when he started feeling lightheaded and dizzy.  Then, 2 days ago, he started noticing black, soft stools, which led him to present to the ED.  Denies any hematochezia  Patient denies any abdominal pain, nausea, vomiting, hematemesis, dysphagia, changes in appetite, early satiety, unintentional weight loss.  Patient takes 81 mg aspirin daily but denies NSAID or blood thinner use.  Denies prior GI bleeding.  Denies family history of colon cancer or gastrointestinal malignancies.  Last colonoscopy 09/2016: One 21mm polyp in distal ascending colon, one 39mm polyp in sigmoid, one 5 mm polyp in rectum (bx: tubular adenoma x 1 and benign polypoid mucosa with prolapse-type changes x 1, no high grade dysplasia).  He has never had an EGD.  Past Medical History:  Diagnosis Date  . Diabetes mellitus without complication ([place])    oral med and insulin use  . Hyperlipidemia   . Hypertension     Past Surgical History:  Procedure Laterality Date  . COLONOSCOPY WITH PROPOFOL N/A 09/24/2016   Procedure: COLONOSCOPY WITH PROPOFOL;  Surgeon: Garlan Fair, MD;  Location: [facility];  Service: Endoscopy;  Laterality: N/A;  . FLEXIBLE SIGMOIDOSCOPY N/A 06/17/2016   Procedure: FLEXIBLE SIGMOIDOSCOPY;  Surgeon: Garlan Fair, MD;  Location: [facility];  Service: Endoscopy;  Laterality: N/A;  Scope advanced to Sigmoid colon, Full colonoscopy aborted due to unsatisfactory prep.  Marland Kitchen HERNIA REPAIR     hx of   . PENILE PROSTHESIS IMPLANT N/A 07/04/2014   Procedure: PENILE PROTHESIS INFLATABLE, Three piece;  Surgeon: Claybon Jabs, MD;   Location: [facility];  Service: Urology;  Laterality: N/A;    Prior to Admission medications   Medication Sig Start Date End Date Taking? Authorizing Provider  aspirin EC 81 MG tablet Take 81 mg by mouth every morning.   Yes [provider]  atorvastatin (LIPITOR) 20 MG tablet Take 20 mg by mouth daily. 05/06/20  Yes [provider]  insulin glargine (LANTUS) 100 UNIT/ML injection Inject 45 Units into the skin at bedtime.    Yes [provider]  lisinopril-hydrochlorothiazide (ZESTORETIC) 20-25 MG tablet Take 1 tablet by mouth daily. 03/16/20  Yes [provider]  metFORMIN (GLUCOPHAGE) 1000 MG tablet Take 1,000 mg by mouth 2 (two) times daily with a meal.   Yes [provider]  spironolactone (ALDACTONE) 25 MG tablet Take 25 mg by mouth 2 (two) times daily. 04/27/20  Yes [provider]  tamsulosin (FLOMAX) 0.4 MG CAPS capsule Take 0.4 mg by mouth at bedtime.  01/16/20  Yes [provider]  telmisartan (MICARDIS) 80 MG tablet Take 80 mg by mouth daily. 03/21/20  Yes [provider]    Scheduled Meds: . atorvastatin  20 mg Oral Daily  . insulin aspart  0-9 Units Subcutaneous Q4H  . insulin glargine  20 Units Subcutaneous QHS  . [START ON 05/20/2020] pantoprazole  40 mg Intravenous Q12H  . tamsulosin  0.4 mg Oral QHS   Continuous Infusions: . pantoprozole (PROTONIX) infusion 8 mg/hr (05/16/20 2344)   PRN Meds:.acetaminophen **OR** acetaminophen, ondansetron **OR** ondansetron (ZOFRAN) IV, polyethylene glycol, senna-docusate  Allergies as  of 05/16/2020  . (No Known Allergies)    No family history on file.  Social History   Socioeconomic History  . Marital status: Divorced    Spouse name: Not on file  . Number of children: Not on file  . Years of education: Not on file  . Highest education level: Not on file  Occupational History  . Not on file  Tobacco Use  . Smoking status: Former Smoker     Packs/day: 2.00    Years: 30.00    Pack years: 60.00    Types: Cigarettes    Quit date: 09/16/1994    Years since quitting: 25.6  . Smokeless tobacco: Never Used  Vaping Use  . Vaping Use: Never used  Substance and Sexual Activity  . Alcohol use: No    Comment: past hx stopped in 1993  . Drug use: No  . Sexual activity: Not on file  Other Topics Concern  . Not on file  Social History Narrative  . Not on file   Social Determinants of Health   Financial Resource Strain:   . Difficulty of Paying Living Expenses: Not on file  Food Insecurity:   . Worried About Charity fundraiser in the Last Year: Not on file  . Ran Out of Food in the Last Year: Not on file  Transportation Needs:   . Lack of Transportation (Medical): Not on file  . Lack of Transportation (Non-Medical): Not on file  Physical Activity:   . Days of Exercise per Week: Not on file  . Minutes of Exercise per Session: Not on file  Stress:   . Feeling of Stress : Not on file  Social Connections:   . Frequency of Communication with Friends and Family: Not on file  . Frequency of Social Gatherings with Friends and Family: Not on file  . Attends Religious Services: Not on file  . Active Member of Clubs or Organizations: Not on file  . Attends Archivist Meetings: Not on file  . Marital Status: Not on file  Intimate Partner Violence:   . Fear of Current or Ex-Partner: Not on file  . Emotionally Abused: Not on file  . Physically Abused: Not on file  . Sexually Abused: Not on file    Review of Systems: Review of Systems  Constitutional: Negative for chills, fever and weight loss.  HENT: Negative for hearing loss and tinnitus.   Eyes: Negative for pain and redness.  Respiratory: Negative for cough and shortness of breath.   Cardiovascular: Negative for chest pain and palpitations.  Gastrointestinal: Positive for melena. Negative for abdominal pain, blood in stool, constipation, diarrhea, heartburn, nausea  and vomiting.  Genitourinary: Negative for flank pain and hematuria.  Musculoskeletal: Negative for falls and joint pain.  Skin: Negative for itching and rash.  Neurological: Positive for dizziness. Negative for loss of consciousness and headaches.  Endo/Heme/Allergies: Negative for polydipsia. Does not bruise/bleed easily.  Psychiatric/Behavioral: Negative for substance abuse. The patient is not nervous/anxious.      Physical Exam: Vital signs: Vitals:   05/17/20 0312 05/17/20 0743  BP: (!) 130/51 (!) 133/47  Pulse: 66 (!) 57  Resp: 19 18  Temp: 98.5 F (36.9 C) 98.8 F (37.1 C)  SpO2: 98% 98%     Physical Exam Vitals and nursing note reviewed.  Constitutional:      General: He is not in acute distress.    Appearance: He is overweight.  HENT:     Head: Normocephalic and  atraumatic.     Nose: Nose normal.     Mouth/Throat:     Mouth: Mucous membranes are moist.     Pharynx: Oropharynx is clear.  Eyes:     Extraocular Movements: Extraocular movements intact.     Comments: Conjunctival pallor  Cardiovascular:     Rate and Rhythm: Normal rate and regular rhythm.     Pulses: Normal pulses.     Heart sounds: Normal heart sounds.  Pulmonary:     Effort: Pulmonary effort is normal.     Breath sounds: Normal breath sounds.  Abdominal:     General: Bowel sounds are normal. There is no distension.     Palpations: Abdomen is soft. There is no mass.     Tenderness: There is no abdominal tenderness. There is no guarding or rebound.     Hernia: No hernia is present.  Musculoskeletal:        General: No swelling or tenderness.     Cervical back: Normal range of motion and neck supple.     Comments: SCDs in place  Skin:    General: Skin is warm and dry.  Neurological:     General: No focal deficit present.     Mental Status: He is oriented to person, place, and time. He is lethargic.  Psychiatric:        Mood and Affect: Mood normal.        Behavior: Behavior normal.      GI:  Lab Results: Recent Labs    05/16/20 1419 05/17/20 0440  WBC 9.0 7.2  HGB 7.0* 7.4*  HCT 22.5* 23.7*  PLT 255 236   BMET Recent Labs    05/16/20 1419 05/17/20 0440  NA 138 137  K 4.5 4.1  CL 106 105  CO2 24 21*  GLUCOSE 153* 126*  BUN 32* 26*  CREATININE 1.29* 1.10  CALCIUM 8.5* 8.5*   LFT Recent Labs    05/16/20 1419  PROT 5.8*  ALBUMIN 3.2*  AST 10*  ALT 11  ALKPHOS 38  BILITOT 0.3   PT/INR No results for input(s): LABPROT, INR in the last 72 hours.   Studies/Results: CT HEAD WO CONTRAST  Result Date: 05/16/2020 CLINICAL DATA:  Dizziness EXAM: CT HEAD WITHOUT CONTRAST TECHNIQUE: Contiguous axial images were obtained from the base of the skull through the vertex without intravenous contrast. COMPARISON:  None. FINDINGS: Brain: Normal anatomic configuration. Mild parenchymal volume loss is commensurate with the patient's age. Mild periventricular and subcortical white matter changes are present likely reflecting the sequela of small vessel ischemia. No abnormal intra or extra-axial mass lesion or fluid collection. No abnormal mass effect or midline shift. No evidence of acute intracranial hemorrhage or infarct. Ventricular size is normal. Cerebellum unremarkable. Vascular: Unremarkable Skull: Intact Sinuses/Orbits: There is mild mucosal thickening within the maxillary sinuses bilaterally, right greater than left. Remaining paranasal sinuses are clear. Orbits are unremarkable. Other: Mastoid air cells and middle ear cavities are clear. Remote fracture of the nasal bones is noted. IMPRESSION: 1. No acute intracranial abnormality. 2. Mild maxillary sinus disease. Electronically Signed   By: Fidela Salisbury MD   On: 05/16/2020 15:42    Impression: Suspected upper GI bleeding: Melena and symptomatic anemia -Hemoglobin 7.0 on arrival yesterday, now 7.4 - inappropriate rise in hemoglobin after 1 unit p RBCs -BUN elevated to 26 with Cr 1.10, yesterday BUN 32/Cr  1.29  Plan: EGD today with Dr. Watt Climes.  I thoroughly discussed the procedure to include nature, alternatives (  medical management/PPI/observation), benefits, risks (including but not limited to bleeding, infection, perforation, anesthesia/cardiac and pulmonary complications).  Patient verbalized understanding gave verbal consent to proceed with EGD today.  Continue Protonix 40 mg IV twice daily.  Continue to monitor H&H and transfuse as needed to maintain hemoglobin greater than 7.  [facility] will follow.   LOS: 1 day   Salley Slaughter  PA-C 05/17/2020, 9:23 AM  Contact #  [phone]

## 2020-05-17 NOTE — Progress Notes (Signed)
PROGRESS NOTE    Martin Gordon  XVQ:[id number] DOB: 07-Mar-1946 DOA: 05/16/2020 PCP: Lavone Orn, MD   Brief Narrative: Patient is a 74 yo male with significant medical history of DM2, HTN, and HLD.  Patient presents to the ED with chief complaint of dizziness, myalgia, and CP which coincided with a black stool on Thursday morning at 6 am. Patient reported that he decided to rest following the episode of dizziness and melena on Thursday but he decided to go to the ED at 2 pm after symptoms of dizziness did not subside.  Patient denies abdominal pain, N/V/D, or prior GI bleed.  Patient takes 81 mg ASA daily but denies regular NSAID use. In ED Hgb 7.0, 1U PRBC's transfused on 05/16/20, post-transfusion Hgb 7.4. Pt to have EGD today.    Assessment & Plan:   Gastrointestinal hemorrhage with melena:  -Hgb 7.4 stable, but monitoring for bleeding post EGD, with CBC in am -EGD on 05/17/20 showed non-bleeding gastric ulcers with no stigmata and non-bleeding duodenal ulcers with no stigmata -Biopsy obtained during EGD, pending results -Return to GI clinic for pathology results -Pt on soft diet -Pt on Protonix IV, transition to 40 mg Protonix po bid -Hold 81 mg ASA for 2-4 weeks and avoid NSAID's, with Tylenol use only   DM2: -Continue to hold metformin  -Sensitive scale SSI used for coverage   Symptomatic anemia: -Hgb 7.4, CBC ordered for 05/18/20 am -Continue to monitor for dizziness  HTN: -Continue to hold B/P medications -Pt is hypotensive to normotensive   DVT prophylaxis: SCD's Code Status: Full Family Communication:  Pt declined.  Status is: Inpatient   Dispo: The patient is from: Home              Anticipated d/c is to: Home              Anticipated d/c date is:               Patient currently is not stable for discharge.   Body mass index is 36.76 kg/m.    Consultants:   GI  Procedures:   EGD  Antimicrobials:   None   Subjective: Pt states that he feels much  better today and denies dizziness and states that he tolerated EGD procedure well.   Review of Systems Otherwise negative except as per HPI, including: General: Denies fever, chills, night sweats or unintended weight loss. Resp: Denies cough, wheezing, shortness of breath. Cardiac: Denies chest pain, palpitations, orthopnea, paroxysmal nocturnal dyspnea. GI: Denies abdominal pain, nausea, vomiting, diarrhea or constipation GU: Denies dysuria, frequency, hesitancy or incontinence MS: Denies muscle aches, joint pain or swelling Neuro: Denies headache, neurologic deficits (focal weakness, numbness, tingling), abnormal gait Psych: Denies anxiety, depression, SI/HI/AVH Skin: Denies new rashes or lesions ID: Denies sick contacts, exotic exposures, travel  Examination:  General exam: Appears calm and comfortable  Respiratory system: Clear to auscultation. Respiratory effort normal. Cardiovascular system: S1 & S2 heard, RRR. No JVD, murmurs, rubs, gallops or clicks. No pedal edema. Gastrointestinal system: Abdomen is nondistended, soft and nontender. No organomegaly or masses felt. Normal bowel sounds heard. Central nervous system: Alert and oriented. No focal neurological deficits. Extremities: Symmetric 5 x 5 power. Skin: No rashes, lesions or ulcers Psychiatry: Judgement and insight appear normal. Mood & affect appropriate.     Objective: Vitals:   05/17/20 1153 05/17/20 1202 05/17/20 1212 05/17/20 1230  BP: (!) 81/45 (!) 102/42 (!) 114/48 (!) 121/55  Pulse: 60 81 64 60  Resp:  18 19 17 18   Temp: 98.2 F (36.8 C)   99 F (37.2 C)  TempSrc: Axillary   Oral  SpO2: 98% 100% 99% 98%  Weight:        Intake/Output Summary (Last 24 hours) at 05/17/2020 1452 Last data filed at 05/17/2020 1300 Gross per 24 hour  Intake 540 ml  Output 1425 ml  Net -885 ml   Filed Weights   05/17/20 1029  Weight: (!) 137 kg     Data Reviewed:   CBC: Recent Labs  Lab 05/16/20 1419  05/17/20 0440  WBC 9.0 7.2  NEUTROABS 6.5  --   HGB 7.0* 7.4*  HCT 22.5* 23.7*  MCV 87.5 86.5  PLT 255 063   Basic Metabolic Panel: Recent Labs  Lab 05/16/20 1419 05/17/20 0440  NA 138 137  K 4.5 4.1  CL 106 105  CO2 24 21*  GLUCOSE 153* 126*  BUN 32* 26*  CREATININE 1.29* 1.10  CALCIUM 8.5* 8.5*   GFR: CrCl cannot be calculated (Unknown ideal weight.). Liver Function Tests: Recent Labs  Lab 05/16/20 1419  AST 10*  ALT 11  ALKPHOS 38  BILITOT 0.3  PROT 5.8*  ALBUMIN 3.2*   No results for input(s): LIPASE, AMYLASE in the last 168 hours. No results for input(s): AMMONIA in the last 168 hours. Coagulation Profile: No results for input(s): INR, PROTIME in the last 168 hours. Cardiac Enzymes: No results for input(s): CKTOTAL, CKMB, CKMBINDEX, TROPONINI in the last 168 hours. BNP (last 3 results) No results for input(s): PROBNP in the last 8760 hours. HbA1C: Recent Labs    05/17/20 0440  HGBA1C 6.7*   CBG: Recent Labs  Lab 05/16/20 2159 05/17/20 0035 05/17/20 0134 05/17/20 0312 05/17/20 0739  GLUCAP 159* 203* 210* 165* 70   Lipid Profile: No results for input(s): CHOL, HDL, LDLCALC, TRIG, CHOLHDL, LDLDIRECT in the last 72 hours. Thyroid Function Tests: No results for input(s): TSH, T4TOTAL, FREET4, T3FREE, THYROIDAB in the last 72 hours. Anemia Panel: No results for input(s): VITAMINB12, FOLATE, FERRITIN, TIBC, IRON, RETICCTPCT in the last 72 hours. Sepsis Labs: No results for input(s): PROCALCITON, LATICACIDVEN in the last 168 hours.  Recent Results (from the past 240 hour(s))  SARS Coronavirus 2 by RT PCR (hospital order, performed in [facility] hospital lab) Nasopharyngeal Nasopharyngeal Swab     Status: None   Collection Time: 05/16/20  9:30 PM   Specimen: Nasopharyngeal Swab  Result Value Ref Range Status   SARS Coronavirus 2 NEGATIVE NEGATIVE Final    Comment: (NOTE) SARS-CoV-2 target nucleic acids are NOT DETECTED.  The SARS-CoV-2 RNA is  generally detectable in upper and lower respiratory specimens during the acute phase of infection. The lowest concentration of SARS-CoV-2 viral copies this assay can detect is 250 copies / mL. A negative result does not preclude SARS-CoV-2 infection and should not be used as the sole basis for treatment or other patient management decisions.  A negative result may occur with improper specimen collection / handling, submission of specimen other than nasopharyngeal swab, presence of viral mutation(s) within the areas targeted by this assay, and inadequate number of viral copies (<250 copies / mL). A negative result must be combined with clinical observations, patient history, and epidemiological information.  Fact Sheet for Patients:   StrictlyIdeas.no  Fact Sheet for Healthcare Providers: BankingDealers.co.za  This test is not yet approved or  cleared by the [place] FDA and has been authorized for detection and/or diagnosis of SARS-CoV-2 by FDA under  an Emergency Use Authorization (EUA).  This EUA will remain in effect (meaning this test can be used) for the duration of the COVID-19 declaration under Section 564(b)(1) of the Act, 21 U.S.C. section 360bbb-3(b)(1), unless the authorization is terminated or revoked sooner.  Performed at [facility], [address], [place], [place] [address]          Radiology Studies: CT HEAD WO CONTRAST  Result Date: 05/16/2020 CLINICAL DATA:  Dizziness EXAM: CT HEAD WITHOUT CONTRAST TECHNIQUE: Contiguous axial images were obtained from the base of the skull through the vertex without intravenous contrast. COMPARISON:  None. FINDINGS: Brain: Normal anatomic configuration. Mild parenchymal volume loss is commensurate with the patient's age. Mild periventricular and subcortical white matter changes are present likely reflecting the sequela of small vessel ischemia. No abnormal intra or  extra-axial mass lesion or fluid collection. No abnormal mass effect or midline shift. No evidence of acute intracranial hemorrhage or infarct. Ventricular size is normal. Cerebellum unremarkable. Vascular: Unremarkable Skull: Intact Sinuses/Orbits: There is mild mucosal thickening within the maxillary sinuses bilaterally, right greater than left. Remaining paranasal sinuses are clear. Orbits are unremarkable. Other: Mastoid air cells and middle ear cavities are clear. Remote fracture of the nasal bones is noted. IMPRESSION: 1. No acute intracranial abnormality. 2. Mild maxillary sinus disease. Electronically Signed   By: Fidela Salisbury MD   On: 05/16/2020 15:42        Scheduled Meds: . atorvastatin  20 mg Oral Daily  . insulin aspart  0-9 Units Subcutaneous Q4H  . insulin glargine  20 Units Subcutaneous QHS  . pantoprazole  40 mg Oral BID AC  . tamsulosin  0.4 mg Oral QHS   Continuous Infusions:   LOS: 1 day   Time spent= 20 mins    Dede Query, RN, NP Student   If 7PM-7AM, please contact night-coverage  05/17/2020, 2:52 PM

## 2020-05-18 ENCOUNTER — Other Ambulatory Visit: Payer: Self-pay | Admitting: Physician Assistant

## 2020-05-18 LAB — BASIC METABOLIC PANEL
Anion gap: 7 (ref 5–15)
BUN: 19 mg/dL (ref 8–23)
CO2: 24 mmol/L (ref 22–32)
Calcium: 8.5 mg/dL — ABNORMAL LOW (ref 8.9–10.3)
Chloride: 107 mmol/L (ref 98–111)
Creatinine, Ser: 1.2 mg/dL (ref 0.61–1.24)
GFR calc Af Amer: >60 mL/min (ref >60)
GFR calc non Af Amer: 59 mL/min — ABNORMAL LOW (ref >60)
Glucose, Bld: 93 mg/dL (ref 70–99)
Potassium: 3.8 mmol/L (ref 3.5–5.1)
Sodium: 138 mmol/L (ref 135–145)

## 2020-05-18 LAB — CBC
HCT: 23.8 % — ABNORMAL LOW (ref 39.0–52.0)
Hemoglobin: 7.6 g/dL — ABNORMAL LOW (ref 13.0–17.0)
MCH: 27.7 pg (ref 26.0–34.0)
MCHC: 31.9 g/dL (ref 30.0–36.0)
MCV: 86.9 fL (ref 80.0–100.0)
Platelets: 270 10*3/uL (ref 150–400)
RBC: 2.74 MIL/uL — ABNORMAL LOW (ref 4.22–5.81)
RDW: 13.7 % (ref 11.5–15.5)
WBC: 7.8 10*3/uL (ref 4.0–10.5)
nRBC: 0 % (ref 0.0–0.2)

## 2020-05-18 LAB — MAGNESIUM: Magnesium: 1.9 mg/dL (ref 1.7–2.4)

## 2020-05-18 LAB — SURGICAL PATHOLOGY

## 2020-05-18 LAB — GLUCOSE, CAPILLARY
Glucose-Capillary: 97 mg/dL (ref 70–99)
Glucose-Capillary: 176 mg/dL — ABNORMAL HIGH (ref 70–99)

## 2020-05-18 MED ORDER — PANTOPRAZOLE SODIUM 40 MG PO TBEC
40.0000 mg | DELAYED_RELEASE_TABLET | Freq: Two times a day (BID) | ORAL | 0 refills | Status: AC
Start: 2020-05-18 — End: ?

## 2020-05-18 MED FILL — PANTOPRAZOLE SOD DR 40 MG T: 40 | 30 days supply | Qty: 60 | Fill #0

## 2020-05-18 NOTE — Discharge Summary (Signed)
Physician Discharge Summary  Martin Gordon EUM:[id number] DOB: 04-Aug-1946 DOA: 05/16/2020  PCP: Lavone Orn, MD  Admit date: 05/16/2020 Discharge date: 05/18/2020  Admitted From: Home Disposition: Home   Recommendations for Outpatient Follow-up:  1. Follow up with PCP in 1-2 weeks 2. Please obtain BMP/CBC in one week your next doctors visit.  3. Follow-up with GI in 2-4 weeks with biopsy result. 4. Resume home medications for DM2 and HTN. 5. Hold ASA, continue Protonix for 2-4 weeks.  Home Health: None Equipment/Devices: None Discharge Condition: Stable CODE STATUS: Full Diet recommendation: Regular  Brief/Interim Summary:  Patient is a 70-year male with no known significant past medical history presented with melanotic stool who presented with weakness and symptomatic anemia with a  hemoglobin of 7.0.  GI was consulted underwent endoscopy showing nonbleeding gastric and duodenal ulcer without active bleeding stigmata. Patient was initially placed on a Protonix drip which was transitioned to p.o. twice daily.  Patient examined at bedside without complaints with melanotic stools and dizziness resolved. Patient still has not had any bowel movement since being in the hospital.   Body mass index is 36.76 kg/m.    Discharge Diagnoses:  Principal Problem:   Gastrointestinal hemorrhage with melena Active Problems:   DM2 (diabetes mellitus, type 2) (HCC)   Symptomatic anemia  Gastrointestinal hemorrhage with melena: EGD 05/17/20 revealed non-bleeding gastric ulcers with no stigmata of bleeding (biopsied) and non-bleeding duodenal ulcers with no stigmata of bleeding. -Hemoglobin improving, now 7.6  -BUN has normalized at 19 with Cr 1.20 -Biopsy pending, will treat as outpatient if H. Pylori positive -Continue Protonix 40 mg po once daily. -Hold ASA for 2-4 weeks. -Avoid NSAID's with Tylenol use only. -Repeat CBC in 1-2 wks with PCP with F/U in 4-6 weeks with GI.  DM2: -Resume  metformin, insulin glargline  Symptomatic anemia: -Resolved, stable -F/U with PCP if pt becomes symptomatic  HTN: -Resume B/P medications: HCTZ, spirinolactone -Pt is normotensive   Consultations:  GI  Subjective:  Patient reports feeling well today with no episodes of melanotic stools or dizziness. Patient denies weakness, abdominal pain, or shortness of breath. Patient is ready for discharge home.  Discharge Exam: Vitals:   05/18/20 0316 05/18/20 0755  BP: (!) 139/58 112/67  Pulse: 84 83  Resp: 20 18  Temp: 98.8 F (37.1 C) 98.6 F (37 C)  SpO2: 100% 98%   Vitals:   05/17/20 1942 05/17/20 2307 05/18/20 0316 05/18/20 0755  BP: 134/62 (!) 125/56 (!) 139/58 112/67  Pulse: (!) 58 (!) 55 84 83  Resp: 17 19 20 18   Temp: 98.6 F (37 C) 99 F (37.2 C) 98.8 F (37.1 C) 98.6 F (37 C)  TempSrc: Oral Oral Oral Oral  SpO2: 100% 98% 100% 98%  Weight:        General: Pt is alert, awake, not in acute distress Cardiovascular: RRR, S1/S2 +, no rubs, no gallops Respiratory: CTA bilaterally, no wheezing, no rhonchi Abdominal: Soft, NT, ND, bowel sounds + Extremities: no edema, no cyanosis  Discharge Instructions   Allergies as of 05/18/2020   No Known Allergies     Medication List    STOP taking these medications   aspirin EC 81 MG tablet     TAKE these medications   atorvastatin 20 MG tablet Commonly known as: LIPITOR Take 20 mg by mouth daily.   insulin glargine 100 UNIT/ML injection Commonly known as: LANTUS Inject 45 Units into the skin at bedtime.   lisinopril-hydrochlorothiazide 20-25 MG tablet Commonly known  as: ZESTORETIC Take 1 tablet by mouth daily.   metFORMIN 1000 MG tablet Commonly known as: GLUCOPHAGE Take 1,000 mg by mouth 2 (two) times daily with a meal.   pantoprazole 40 MG tablet Commonly known as: PROTONIX Take 1 tablet (40 mg total) by mouth 2 (two) times daily before a meal.   spironolactone 25 MG tablet Commonly known as:  ALDACTONE Take 25 mg by mouth 2 (two) times daily.   tamsulosin 0.4 MG Caps capsule Commonly known as: FLOMAX Take 0.4 mg by mouth at bedtime.   telmisartan 80 MG tablet Commonly known as: MICARDIS Take 80 mg by mouth daily.       Follow-up Information    Lavone Orn, MD. Schedule an appointment as soon as possible for a visit in 1 week(s).   Specialty: Internal Medicine Contact information: [address] [place] [address] [phone]        Clarene Essex, MD. Schedule an appointment as soon as possible for a visit in 2 week(s).   Specialty: Gastroenterology Contact information: [address] [place] [address] [phone]              No Known Allergies  You were cared for by a hospitalist during your hospital stay. If you have any questions about your discharge medications or the care you received while you were in the hospital after you are discharged, you can call the unit and asked to speak with the hospitalist on call if the hospitalist that took care of you is not available. Once you are discharged, your primary care physician will handle any further medical issues. Please note that no refills for any discharge medications will be authorized once you are discharged, as it is imperative that you return to your primary care physician (or establish a relationship with a primary care physician if you do not have one) for your aftercare needs so that they can reassess your need for medications and monitor your lab values.   Procedures/Studies: CT HEAD WO CONTRAST  Result Date: 05/16/2020 CLINICAL DATA:  Dizziness EXAM: CT HEAD WITHOUT CONTRAST TECHNIQUE: Contiguous axial images were obtained from the base of the skull through the vertex without intravenous contrast. COMPARISON:  None. FINDINGS: Brain: Normal anatomic configuration. Mild parenchymal volume loss is commensurate with the patient's age. Mild periventricular and  subcortical white matter changes are present likely reflecting the sequela of small vessel ischemia. No abnormal intra or extra-axial mass lesion or fluid collection. No abnormal mass effect or midline shift. No evidence of acute intracranial hemorrhage or infarct. Ventricular size is normal. Cerebellum unremarkable. Vascular: Unremarkable Skull: Intact Sinuses/Orbits: There is mild mucosal thickening within the maxillary sinuses bilaterally, right greater than left. Remaining paranasal sinuses are clear. Orbits are unremarkable. Other: Mastoid air cells and middle ear cavities are clear. Remote fracture of the nasal bones is noted. IMPRESSION: 1. No acute intracranial abnormality. 2. Mild maxillary sinus disease. Electronically Signed   By: Fidela Salisbury MD   On: 05/16/2020 15:42      The results of significant diagnostics from this hospitalization (including imaging, microbiology, ancillary and laboratory) are listed below for reference.     Microbiology: Recent Results (from the past 240 hour(s))  SARS Coronavirus 2 by RT PCR (hospital order, performed in [facility] hospital lab) Nasopharyngeal Nasopharyngeal Swab     Status: None   Collection Time: 05/16/20  9:30 PM   Specimen: Nasopharyngeal Swab  Result Value Ref Range Status  SARS Coronavirus 2 NEGATIVE NEGATIVE Final    Comment: (NOTE) SARS-CoV-2 target nucleic acids are NOT DETECTED.  The SARS-CoV-2 RNA is generally detectable in upper and lower respiratory specimens during the acute phase of infection. The lowest concentration of SARS-CoV-2 viral copies this assay can detect is 250 copies / mL. A negative result does not preclude SARS-CoV-2 infection and should not be used as the sole basis for treatment or other patient management decisions.  A negative result may occur with improper specimen collection / handling, submission of specimen other than nasopharyngeal swab, presence of viral mutation(s) within the areas targeted by  this assay, and inadequate number of viral copies (<250 copies / mL). A negative result must be combined with clinical observations, patient history, and epidemiological information.  Fact Sheet for Patients:   StrictlyIdeas.no  Fact Sheet for Healthcare Providers: BankingDealers.co.za  This test is not yet approved or  cleared by the [place] FDA and has been authorized for detection and/or diagnosis of SARS-CoV-2 by FDA under an Emergency Use Authorization (EUA).  This EUA will remain in effect (meaning this test can be used) for the duration of the COVID-19 declaration under Section 564(b)(1) of the Act, 21 U.S.C. section 360bbb-3(b)(1), unless the authorization is terminated or revoked sooner.  Performed at [facility], [place] [address], [place], [place] [address]      Labs: BNP (last 3 results) No results for input(s): BNP in the last 8760 hours. Basic Metabolic Panel: Recent Labs  Lab 05/16/20 1419 05/17/20 0440 05/18/20 0212  NA 138 137 138  K 4.5 4.1 3.8  CL 106 105 107  CO2 24 21* 24  GLUCOSE 153* 126* 93  BUN 32* 26* 19  CREATININE 1.29* 1.10 1.20  CALCIUM 8.5* 8.5* 8.5*  MG  --   --  1.9   Liver Function Tests: Recent Labs  Lab 05/16/20 1419  AST 10*  ALT 11  ALKPHOS 38  BILITOT 0.3  PROT 5.8*  ALBUMIN 3.2*   No results for input(s): LIPASE, AMYLASE in the last 168 hours. No results for input(s): AMMONIA in the last 168 hours. CBC: Recent Labs  Lab 05/16/20 1419 05/17/20 0440 05/18/20 0212  WBC 9.0 7.2 7.8  NEUTROABS 6.5  --   --   HGB 7.0* 7.4* 7.6*  HCT 22.5* 23.7* 23.8*  MCV 87.5 86.5 86.9  PLT 255 236 270   Cardiac Enzymes: No results for input(s): CKTOTAL, CKMB, CKMBINDEX, TROPONINI in the last 168 hours. BNP: Invalid input(s): POCBNP CBG: Recent Labs  Lab 05/17/20 1546 05/17/20 1945 05/17/20 2306 05/18/20 0311 05/18/20 0752  GLUCAP 175* 243* 135* 97 176*    D-Dimer No results for input(s): DDIMER in the last 72 hours. Hgb A1c Recent Labs    05/17/20 0440  HGBA1C 6.7*   Lipid Profile No results for input(s): CHOL, HDL, LDLCALC, TRIG, CHOLHDL, LDLDIRECT in the last 72 hours. Thyroid function studies No results for input(s): TSH, T4TOTAL, T3FREE, THYROIDAB in the last 72 hours.  Invalid input(s): FREET3 Anemia work up No results for input(s): VITAMINB12, FOLATE, FERRITIN, TIBC, IRON, RETICCTPCT in the last 72 hours. Urinalysis    Component Value Date/Time   COLORURINE STRAW (A) 07/14/2018 2138   APPEARANCEUR CLEAR 07/14/2018 2138   LABSPEC 1.008 07/14/2018 2138   PHURINE 5.0 07/14/2018 2138   GLUCOSEU NEGATIVE 07/14/2018 2138   HGBUR LARGE (A) 07/14/2018 2138   BILIRUBINUR NEGATIVE 07/14/2018 2138   [place] 07/14/2018 2138   PROTEINUR NEGATIVE 07/14/2018 2138  NITRITE NEGATIVE 07/14/2018 2138   LEUKOCYTESUR NEGATIVE 07/14/2018 2138   Sepsis Labs Invalid input(s): PROCALCITONIN,  WBC,  LACTICIDVEN Microbiology Recent Results (from the past 240 hour(s))  SARS Coronavirus 2 by RT PCR (hospital order, performed in [facility] hospital lab) Nasopharyngeal Nasopharyngeal Swab     Status: None   Collection Time: 05/16/20  9:30 PM   Specimen: Nasopharyngeal Swab  Result Value Ref Range Status   SARS Coronavirus 2 NEGATIVE NEGATIVE Final    Comment: (NOTE) SARS-CoV-2 target nucleic acids are NOT DETECTED.  The SARS-CoV-2 RNA is generally detectable in upper and lower respiratory specimens during the acute phase of infection. The lowest concentration of SARS-CoV-2 viral copies this assay can detect is 250 copies / mL. A negative result does not preclude SARS-CoV-2 infection and should not be used as the sole basis for treatment or other patient management decisions.  A negative result may occur with improper specimen collection / handling, submission of specimen other than nasopharyngeal swab, presence of viral  mutation(s) within the areas targeted by this assay, and inadequate number of viral copies (<250 copies / mL). A negative result must be combined with clinical observations, patient history, and epidemiological information.  Fact Sheet for Patients:   StrictlyIdeas.no  Fact Sheet for Healthcare Providers: BankingDealers.co.za  This test is not yet approved or  cleared by the [place] FDA and has been authorized for detection and/or diagnosis of SARS-CoV-2 by FDA under an Emergency Use Authorization (EUA).  This EUA will remain in effect (meaning this test can be used) for the duration of the COVID-19 declaration under Section 564(b)(1) of the Act, 21 U.S.C. section 360bbb-3(b)(1), unless the authorization is terminated or revoked sooner.  Performed at [facility], [place] [address], [place], [place] [address]      Time coordinating discharge:  I have spent 35 minutes face to face with the patient and on the ward discussing the patients care, assessment, plan and disposition with other care givers. >50% of the time was devoted counseling the patient about the risks and benefits of treatment/Discharge disposition and coordinating care.   SIGNED:  Dede Query, RN NP-Student   05/18/2020, 9:08 AM  If 7PM-7AM, please contact night-coverage

## 2020-05-18 NOTE — Anesthesia Postprocedure Evaluation (Signed)
Anesthesia Post Note  Patient: Aleksi Brummet  Procedure(s) Performed: ESOPHAGOGASTRODUODENOSCOPY (EGD) (N/A ) BIOPSY     Patient location during evaluation: Endoscopy Anesthesia Type: MAC Level of consciousness: awake Pain management: pain level controlled Vital Signs Assessment: post-procedure vital signs reviewed and stable Respiratory status: spontaneous breathing Cardiovascular status: stable Postop Assessment: no apparent nausea or vomiting Anesthetic complications: no   No complications documented.  Last Vitals:  Vitals:   05/18/20 0316 05/18/20 0755  BP: (!) 139/58 112/67  Pulse: 84 83  Resp: 20 18  Temp: 37.1 C 37 C  SpO2: 100% 98%    Last Pain:  Vitals:   05/18/20 0755  TempSrc: Oral  PainSc:                  Niomie Englert

## 2020-05-18 NOTE — Discharge Instructions (Signed)
Gastrointestinal Bleeding Gastrointestinal (GI) bleeding is bleeding somewhere along the digestive tract, between the mouth and the anus. This tract includes the mouth, esophagus, stomach, small intestine, large intestine, and anus. The large intestine is often called the colon. GI bleeding can be caused by various problems. The severity of these problems can range from mild to serious or even life-threatening. If you have GI bleeding, you may find blood in your stools (feces), you may have black stools, or you may vomit blood. If there is a lot of bleeding, you may need to stay in the hospital. What are the causes? This condition may be caused by:  Inflammation, irritation, or swelling of the esophagus (esophagitis). The esophagus is part of the body that moves food from your mouth to your stomach.  Swollen veins in the rectum (hemorrhoids).  Areas of painful tearing in the anus that are often caused by passing hard stool (anal fissures).  Pouches that form on the colon over time, with age, and may bleed a lot (diverticulosis).  Inflammation (diverticulitis) in areas with diverticulosis. This can cause pain, fever, and bloody stools, although bleeding may be mild.  Growths (polyps) or cancer. Colon cancer often starts out as precancerous polyps.  Gastritis and ulcers. With these, bleeding may come from the upper GI tract, near the stomach. What increases the risk? You are more likely to develop this condition if you:  Have an infection in your stomach from a type of bacteria called Helicobacter pylori.  Take certain medicines, such as: ? NSAIDs. ? Aspirin. ? Selective serotonin reuptake inhibitors (SSRIs). ? Steroids. ? Antiplatelet or anticoagulant medicines.  Smoke.  Drink alcohol. What are the signs or symptoms? Common symptoms of this condition include:  Bright red blood in your vomit, or vomit that looks like coffee grounds.  Bloody, black, or tarry stools. ? Bleeding  from the lower GI tract will usually cause red or maroon blood in the stools. ? Bleeding from the upper GI tract may cause black, tarry stools that are often stronger smelling than usual. ? In certain cases, if the bleeding is fast enough, the stools may be red.  Pain or cramping in the abdomen. How is this diagnosed? This condition may be diagnosed based on:  Your medical history and a physical exam.  Various tests, such as: ? Blood tests. ? Stool tests. ? X-rays and other imaging tests. ? Esophagogastroduodenoscopy (EGD). In this test, a flexible, lighted tube is used to look at your esophagus, stomach, and small intestine. ? Colonoscopy. In this test, a flexible, lighted tube is used to look at your colon. How is this treated? Treatment for this condition depends on the cause of the bleeding. For example:  For bleeding from the esophagus, stomach, small intestine, or colon, the health care provider doing your EGD or colonoscopy may be able to stop the bleeding as part of the procedure.  Inflammation or infection of the colon can be treated with medicines.  Certain rectal problems can be treated with creams, suppositories, or warm baths.  Medicines may be given to reduce acid in your stomach.  Surgery is sometimes needed.  Blood transfusions are sometimes needed if a lot of blood has been lost. If bleeding is mild, you may be allowed to go home. If there is a lot of bleeding, you will need to stay in the hospital for observation. Follow these instructions at home:   Take over-the-counter and prescription medicines only as told by your health care provider.    Eat foods that are high in fiber, such as beans, whole grains, and fresh fruits and vegetables. This will help to keep your stools soft. Eating 1-3 prunes each day works well for many people.  Drink enough fluid to keep your urine pale yellow.  Keep all follow-up visits as told by your health care provider. This is  important. Contact a health care provider if:  Your symptoms do not improve. Get help right away if:  Your bleeding does not stop.  You feel light-headed or you faint.  You feel weak.  You have severe cramps in your back or abdomen.  You pass large blood clots in your stool.  Your symptoms are getting worse.  You have chest pain or fast heartbeats. Summary  Gastrointestinal (GI) bleeding is bleeding somewhere along the digestive tract, between the mouth and anus. GI bleeding can be caused by various problems. The severity of these problems can range from mild to serious or even life-threatening.  Treatment for this condition depends on the cause of the bleeding.  Take over-the-counter and prescription medicines only as told by your health care provider.  Keep all follow-up visits as told by your health care provider. This is important.  Get help right away if your bleeding increases, your symptoms are getting worse, or you have new symptoms. This information is not intended to replace advice given to you by your health care provider. Make sure you discuss any questions you have with your health care provider. Document Revised: 04/15/2018 Document Reviewed: 04/15/2018 Elsevier Patient Education  2020 Elsevier Inc.  

## 2020-05-18 NOTE — Progress Notes (Signed)
[facility] Progress Note  Martin Gordon 74 y.o. 12-Oct-1945  CC:  Upper GI bleeding, resolved  Subjective: Patient feels well today and states he is ready to go home. His energy has improved and he has no complaints. Denies abdominal pain, chest pain, shortness of breath, weakness, or dizziness.  No bowel movement since admission.  ROS : Review of Systems  Respiratory: Negative for cough and shortness of breath.   Cardiovascular: Negative for chest pain and palpitations.  Gastrointestinal: Negative for abdominal pain, blood in stool, constipation, diarrhea, heartburn, melena, nausea and vomiting.  Neurological: Negative for dizziness and weakness.   Objective: Vital signs in last 24 hours: Vitals:   05/18/20 0316 05/18/20 0755  BP: (!) 139/58 112/67  Pulse: 84 83  Resp: 20 18  Temp: 98.8 F (37.1 C) 98.6 F (37 C)  SpO2: 100% 98%    Physical Exam:  General:  Alert, oriented, cooperative, no acute distress  Head:  Normocephalic, without obvious abnormality, atraumatic  Eyes:  Mild conjunctival pallor, EOMs intact  Lungs:   Breathing comfortably on room air  Heart:  Regular rate and rhythm  Abdomen:   Soft, non-tender, non-distended; no guarding or peritoneal signs  Extremities: Extremities normal, atraumatic, no  edema  Pulses: 2+ and symmetric    Lab Results: Recent Labs    05/17/20 0440 05/18/20 0212  NA 137 138  K 4.1 3.8  CL 105 107  CO2 21* 24  GLUCOSE 126* 93  BUN 26* 19  CREATININE 1.10 1.20  CALCIUM 8.5* 8.5*  MG  --  1.9   Recent Labs    05/16/20 1419  AST 10*  ALT 11  ALKPHOS 38  BILITOT 0.3  PROT 5.8*  ALBUMIN 3.2*   Recent Labs    05/16/20 1419 05/16/20 1419 05/17/20 0440 05/18/20 0212  WBC 9.0   < > 7.2 7.8  NEUTROABS 6.5  --   --   --   HGB 7.0*   < > 7.4* 7.6*  HCT 22.5*   < > 23.7* 23.8*  MCV 87.5   < > 86.5 86.9  PLT 255   < > 236 270   < > = values in this interval not displayed.   No results for input(s): LABPROT,  INR in the last 72 hours.  Assessment: Upper GI bleeding, resolved: EGD 05/17/20 revealed non-bleeding gastric ulcers with no stigmata of bleeding (biopsied) and non-bleeding duodenal ulcers with no stigmata of bleeding. -Hemoglobin gradually improving, now 7.6  -BUN has normalized, now 19 with Cr 1.20  Plan: Continue Protonix 40 mg PO once daily. Hold aspirin for 2-4 weeks.  If patient remains on aspirin, will require PPI indefinitely.  Await biopsy results; it positive for H. Pylori, we will treat.  Recommend repeat CBC in 1-2 weeks with follow up in our office in 4-6 weeks.  OK to discharge from a GI standpoint.   [facility] will sign off. Please contact us if we can be of any further assistance during this hospital stay.  Salley Slaughter PA-C 05/18/2020, 9:58 AM  Contact #  [phone]

## 2020-05-18 NOTE — TOC Transition Note (Signed)
Transition of Care [facility]) - CM/SW Discharge Note   Patient Details  Name: Marius Betts MRN: [id number] Date of Birth: 12/18/1945  [facility] CM/SW Contact:  Pollie Friar, RN Phone Number: 05/18/2020, 10:03 AM   Clinical Narrative:    Pt discharging home with self care. Medications for home to be delivered to the room per [place]. Pt has transportation home.   Final next level of care: Home/Self Care Barriers to Discharge: No Barriers Identified   Patient Goals and CMS Choice        Discharge Placement                       Discharge Plan and Services                                     Social Determinants of Health (SDOH) Interventions     Readmission Risk Interventions No flowsheet data found.

## 2020-05-21 ENCOUNTER — Encounter (HOSPITAL_COMMUNITY): Payer: Self-pay | Admitting: Gastroenterology

## 2020-05-25 DIAGNOSIS — K922 Gastrointestinal hemorrhage, unspecified: Secondary | ICD-10-CM | POA: Diagnosis not present

## 2020-05-25 DIAGNOSIS — Z23 Encounter for immunization: Secondary | ICD-10-CM | POA: Diagnosis not present

## 2020-06-20 DIAGNOSIS — Z86010 Personal history of colonic polyps: Secondary | ICD-10-CM | POA: Diagnosis not present

## 2020-06-20 DIAGNOSIS — A048 Other specified bacterial intestinal infections: Secondary | ICD-10-CM | POA: Diagnosis not present

## 2020-06-20 DIAGNOSIS — K279 Peptic ulcer, site unspecified, unspecified as acute or chronic, without hemorrhage or perforation: Secondary | ICD-10-CM | POA: Diagnosis not present

## 2020-06-20 DIAGNOSIS — D500 Iron deficiency anemia secondary to blood loss (chronic): Secondary | ICD-10-CM | POA: Diagnosis not present

## 2020-06-21 ENCOUNTER — Ambulatory Visit: Payer: Medicare HMO | Admitting: Podiatry

## 2020-06-21 DIAGNOSIS — H52223 Regular astigmatism, bilateral: Secondary | ICD-10-CM | POA: Diagnosis not present

## 2020-06-21 DIAGNOSIS — H524 Presbyopia: Secondary | ICD-10-CM | POA: Diagnosis not present

## 2020-06-21 DIAGNOSIS — I10 Essential (primary) hypertension: Secondary | ICD-10-CM | POA: Diagnosis not present

## 2020-06-21 DIAGNOSIS — E11319 Type 2 diabetes mellitus with unspecified diabetic retinopathy without macular edema: Secondary | ICD-10-CM | POA: Diagnosis not present

## 2020-06-21 DIAGNOSIS — H25099 Other age-related incipient cataract, unspecified eye: Secondary | ICD-10-CM | POA: Diagnosis not present

## 2020-06-21 DIAGNOSIS — H11159 Pinguecula, unspecified eye: Secondary | ICD-10-CM | POA: Diagnosis not present

## 2020-06-21 DIAGNOSIS — Z7984 Long term (current) use of oral hypoglycemic drugs: Secondary | ICD-10-CM | POA: Diagnosis not present

## 2020-06-21 DIAGNOSIS — Z794 Long term (current) use of insulin: Secondary | ICD-10-CM | POA: Diagnosis not present

## 2020-06-21 DIAGNOSIS — H5203 Hypermetropia, bilateral: Secondary | ICD-10-CM | POA: Diagnosis not present

## 2020-06-21 DIAGNOSIS — Z0100 Encounter for examination of eyes and vision without abnormal findings: Secondary | ICD-10-CM | POA: Diagnosis not present

## 2020-06-30 DIAGNOSIS — E7800 Pure hypercholesterolemia, unspecified: Secondary | ICD-10-CM | POA: Diagnosis not present

## 2020-06-30 DIAGNOSIS — I10 Essential (primary) hypertension: Secondary | ICD-10-CM | POA: Diagnosis not present

## 2020-06-30 DIAGNOSIS — E11621 Type 2 diabetes mellitus with foot ulcer: Secondary | ICD-10-CM | POA: Diagnosis not present

## 2020-06-30 DIAGNOSIS — E1142 Type 2 diabetes mellitus with diabetic polyneuropathy: Secondary | ICD-10-CM | POA: Diagnosis not present

## 2020-06-30 DIAGNOSIS — Z8546 Personal history of malignant neoplasm of prostate: Secondary | ICD-10-CM | POA: Diagnosis not present

## 2020-06-30 DIAGNOSIS — C61 Malignant neoplasm of prostate: Secondary | ICD-10-CM | POA: Diagnosis not present

## 2020-06-30 DIAGNOSIS — E1165 Type 2 diabetes mellitus with hyperglycemia: Secondary | ICD-10-CM | POA: Diagnosis not present

## 2020-06-30 DIAGNOSIS — D500 Iron deficiency anemia secondary to blood loss (chronic): Secondary | ICD-10-CM | POA: Diagnosis not present

## 2020-06-30 DIAGNOSIS — E113219 Type 2 diabetes mellitus with mild nonproliferative diabetic retinopathy with macular edema, unspecified eye: Secondary | ICD-10-CM | POA: Diagnosis not present

## 2020-07-04 ENCOUNTER — Other Ambulatory Visit: Payer: Self-pay

## 2020-07-04 ENCOUNTER — Encounter: Payer: Self-pay | Admitting: Podiatrist

## 2020-07-04 ENCOUNTER — Ambulatory Visit: Payer: Medicare HMO | Admitting: Podiatrist

## 2020-07-04 DIAGNOSIS — M79674 Pain in right toe(s): Secondary | ICD-10-CM | POA: Diagnosis not present

## 2020-07-04 DIAGNOSIS — B351 Tinea unguium: Secondary | ICD-10-CM

## 2020-07-04 DIAGNOSIS — M7751 Other enthesopathy of right foot: Secondary | ICD-10-CM

## 2020-07-04 DIAGNOSIS — M79675 Pain in left toe(s): Secondary | ICD-10-CM | POA: Diagnosis not present

## 2020-07-04 DIAGNOSIS — M7752 Other enthesopathy of left foot: Secondary | ICD-10-CM

## 2020-07-04 DIAGNOSIS — E1142 Type 2 diabetes mellitus with diabetic polyneuropathy: Secondary | ICD-10-CM

## 2020-07-04 NOTE — Progress Notes (Signed)
This patient presents to the office with chief complaint of long thick nails and diabetic foot check.  This patient  says there  is  no pain and discomfort in his  feet however there is numbness and lack of sensation reported.  This patient says there are long thick painful nails.  These nails are painful walking and wearing shoes.  Patient has no history of infection or drainage from both feet. Patient has history of foreign body injury left big toe.  Patient is unable to  self treat his own nails.  When asked, he states he has had diabetic shoes in the past, but currently does not have a pair he is able to wear.    General Appearance  Alert, conversant and in no acute stress.   Vascular  Dorsalis pedis and posterior tibial  pulses are weakly  palpable  bilaterally.  Capillary return is within normal limits  bilaterally. Temperature is within normal limits  bilaterally.   Neurologic  Senn-Weinstein monofilament wire test diminished/absent   bilaterally. Muscle power within normal limits bilaterally.   Nails Thick disfigured discolored nails with subungual debris  from hallux to fifth toes bilaterally. No evidence of bacterial infection or drainage bilaterally.   Orthopedic  No limitations of motion of motion feet .  No crepitus or effusions noted. Prominent boney spurring present right foot medial cuneiform/ navicular region both dorsal and plantar.  DJD 1st MPJ  B/L.  Deviation left hallux at IPJ.   Skin  normotropic skin with no porokeratosis noted bilaterally.  No signs of infections or ulcers noted.      Onychomycosis  Diabetes with neuropathy.  Assessment:    ICD-10-CM   1. Pain due to onychomycosis of toenails of both feet  B35.1    M79.675    M79.674   2. Diabetic polyneuropathy associated with type 2 diabetes mellitus (HCC)  E11.42   3. Bone spur of left foot  M77.52   4. Bone spur of right foot  M77.51         Debride nails x 10.  A diabetic foot exam was performed and there is   evidence of  vascular or neurologic pathology. Patient qualifies for diabetic shoes.  will get the process started for dm shoes.    RTC 3 months.

## 2020-07-04 NOTE — Patient Instructions (Signed)
Diabetes Mellitus and Foot Care Foot care is an important part of your health, especially when you have diabetes. Diabetes may cause you to have problems because of poor blood flow (circulation) to your feet and legs, which can cause your skin to:  Become thinner and drier.  Break more easily.  Heal more slowly.  Peel and crack. You may also have nerve damage (neuropathy) in your legs and feet, causing decreased feeling in them. This means that you may not notice minor injuries to your feet that could lead to more serious problems. Noticing and addressing any potential problems early is the best way to prevent future foot problems. How to care for your feet Foot hygiene  Wash your feet daily with warm water and mild soap. Do not use hot water. Then, pat your feet and the areas between your toes until they are completely dry. Do not soak your feet as this can dry your skin.  Trim your toenails straight across. Do not dig under them or around the cuticle. File the edges of your nails with an emery board or nail file.  Apply a moisturizing lotion or petroleum jelly to the skin on your feet and to dry, brittle toenails. Use lotion that does not contain alcohol and is unscented. Do not apply lotion between your toes. Shoes and socks  Wear clean socks or stockings every day. Make sure they are not too tight. Do not wear knee-high stockings since they may decrease blood flow to your legs.  Wear shoes that fit properly and have enough cushioning. Always look in your shoes before you put them on to be sure there are no objects inside.  To break in new shoes, wear them for just a few hours a day. This prevents injuries on your feet. Wounds, scrapes, corns, and calluses  Check your feet daily for blisters, cuts, bruises, sores, and redness. If you cannot see the bottom of your feet, use a mirror or ask someone for help.  Do not cut corns or calluses or try to remove them with medicine.  If you  find a minor scrape, cut, or break in the skin on your feet, keep it and the skin around it clean and dry. You may clean these areas with mild soap and water. Do not clean the area with peroxide, alcohol, or iodine.  If you have a wound, scrape, corn, or callus on your foot, look at it several times a day to make sure it is healing and not infected. Check for: ? Redness, swelling, or pain. ? Fluid or blood. ? Warmth. ? Pus or a bad smell. General instructions  Do not cross your legs. This may decrease blood flow to your feet.  Do not use heating pads or hot water bottles on your feet. They may burn your skin. If you have lost feeling in your feet or legs, you may not know this is happening until it is too late.  Protect your feet from hot and cold by wearing shoes, such as at the beach or on hot pavement.  Schedule a complete foot exam at least once a year (annually) or more often if you have foot problems. If you have foot problems, report any cuts, sores, or bruises to your health care provider immediately. Contact a health care provider if:  You have a medical condition that increases your risk of infection and you have any cuts, sores, or bruises on your feet.  You have an injury that is not   healing.  You have redness on your legs or feet.  You feel burning or tingling in your legs or feet.  You have pain or cramps in your legs and feet.  Your legs or feet are numb.  Your feet always feel cold.  You have pain around a toenail. Get help right away if:  You have a wound, scrape, corn, or callus on your foot and: ? You have pain, swelling, or redness that gets worse. ? You have fluid or blood coming from the wound, scrape, corn, or callus. ? Your wound, scrape, corn, or callus feels warm to the touch. ? You have pus or a bad smell coming from the wound, scrape, corn, or callus. ? You have a fever. ? You have a red line going up your leg. Summary  Check your feet every day  for cuts, sores, red spots, swelling, and blisters.  Moisturize feet and legs daily.  Wear shoes that fit properly and have enough cushioning.  If you have foot problems, report any cuts, sores, or bruises to your health care provider immediately.  Schedule a complete foot exam at least once a year (annually) or more often if you have foot problems. This information is not intended to replace advice given to you by your health care provider. Make sure you discuss any questions you have with your health care provider. Document Revised: 05/26/2019 Document Reviewed: 10/04/2016 [employer] Patient Education  2020 [employer]  

## 2020-07-24 DIAGNOSIS — D500 Iron deficiency anemia secondary to blood loss (chronic): Secondary | ICD-10-CM | POA: Diagnosis not present

## 2020-07-24 DIAGNOSIS — R0989 Other specified symptoms and signs involving the circulatory and respiratory systems: Secondary | ICD-10-CM | POA: Diagnosis not present

## 2020-07-24 DIAGNOSIS — Z1389 Encounter for screening for other disorder: Secondary | ICD-10-CM | POA: Diagnosis not present

## 2020-07-24 DIAGNOSIS — R0600 Dyspnea, unspecified: Secondary | ICD-10-CM | POA: Diagnosis not present

## 2020-07-24 DIAGNOSIS — Z8546 Personal history of malignant neoplasm of prostate: Secondary | ICD-10-CM | POA: Diagnosis not present

## 2020-07-24 DIAGNOSIS — E7800 Pure hypercholesterolemia, unspecified: Secondary | ICD-10-CM | POA: Diagnosis not present

## 2020-07-24 DIAGNOSIS — I10 Essential (primary) hypertension: Secondary | ICD-10-CM | POA: Diagnosis not present

## 2020-07-24 DIAGNOSIS — E1142 Type 2 diabetes mellitus with diabetic polyneuropathy: Secondary | ICD-10-CM | POA: Diagnosis not present

## 2020-07-24 DIAGNOSIS — E113213 Type 2 diabetes mellitus with mild nonproliferative diabetic retinopathy with macular edema, bilateral: Secondary | ICD-10-CM | POA: Diagnosis not present

## 2020-07-24 DIAGNOSIS — Z0000 Encounter for general adult medical examination without abnormal findings: Secondary | ICD-10-CM | POA: Diagnosis not present

## 2020-07-24 DIAGNOSIS — G4733 Obstructive sleep apnea (adult) (pediatric): Secondary | ICD-10-CM | POA: Diagnosis not present

## 2020-07-26 ENCOUNTER — Other Ambulatory Visit: Payer: Self-pay | Admitting: Internal Medicine

## 2020-07-26 DIAGNOSIS — R0989 Other specified symptoms and signs involving the circulatory and respiratory systems: Secondary | ICD-10-CM

## 2020-07-31 ENCOUNTER — Ambulatory Visit
Admission: RE | Admit: 2020-07-31 | Discharge: 2020-07-31 | Disposition: A | Discharge status: Home / Self Care | Payer: Medicare HMO | Source: Ambulatory Visit | Attending: Internal Medicine | Admitting: Internal Medicine

## 2020-07-31 DIAGNOSIS — E785 Hyperlipidemia, unspecified: Secondary | ICD-10-CM | POA: Diagnosis not present

## 2020-07-31 DIAGNOSIS — R0989 Other specified symptoms and signs involving the circulatory and respiratory systems: Secondary | ICD-10-CM

## 2020-08-28 DIAGNOSIS — Z8546 Personal history of malignant neoplasm of prostate: Secondary | ICD-10-CM | POA: Diagnosis not present

## 2020-08-28 DIAGNOSIS — I10 Essential (primary) hypertension: Secondary | ICD-10-CM | POA: Diagnosis not present

## 2020-08-28 DIAGNOSIS — E1165 Type 2 diabetes mellitus with hyperglycemia: Secondary | ICD-10-CM | POA: Diagnosis not present

## 2020-08-28 DIAGNOSIS — C61 Malignant neoplasm of prostate: Secondary | ICD-10-CM | POA: Diagnosis not present

## 2020-08-28 DIAGNOSIS — D500 Iron deficiency anemia secondary to blood loss (chronic): Secondary | ICD-10-CM | POA: Diagnosis not present

## 2020-08-28 DIAGNOSIS — E1142 Type 2 diabetes mellitus with diabetic polyneuropathy: Secondary | ICD-10-CM | POA: Diagnosis not present

## 2020-08-28 DIAGNOSIS — E7800 Pure hypercholesterolemia, unspecified: Secondary | ICD-10-CM | POA: Diagnosis not present

## 2020-10-20 DIAGNOSIS — C61 Malignant neoplasm of prostate: Secondary | ICD-10-CM | POA: Diagnosis not present

## 2020-10-20 DIAGNOSIS — Z8546 Personal history of malignant neoplasm of prostate: Secondary | ICD-10-CM | POA: Diagnosis not present

## 2020-10-20 DIAGNOSIS — E1142 Type 2 diabetes mellitus with diabetic polyneuropathy: Secondary | ICD-10-CM | POA: Diagnosis not present

## 2020-10-20 DIAGNOSIS — E11621 Type 2 diabetes mellitus with foot ulcer: Secondary | ICD-10-CM | POA: Diagnosis not present

## 2020-10-20 DIAGNOSIS — I10 Essential (primary) hypertension: Secondary | ICD-10-CM | POA: Diagnosis not present

## 2020-10-20 DIAGNOSIS — E7800 Pure hypercholesterolemia, unspecified: Secondary | ICD-10-CM | POA: Diagnosis not present

## 2020-10-20 DIAGNOSIS — E1165 Type 2 diabetes mellitus with hyperglycemia: Secondary | ICD-10-CM | POA: Diagnosis not present

## 2020-10-20 DIAGNOSIS — D500 Iron deficiency anemia secondary to blood loss (chronic): Secondary | ICD-10-CM | POA: Diagnosis not present

## 2020-10-25 DIAGNOSIS — C61 Malignant neoplasm of prostate: Secondary | ICD-10-CM | POA: Diagnosis not present

## 2020-11-01 DIAGNOSIS — N401 Enlarged prostate with lower urinary tract symptoms: Secondary | ICD-10-CM | POA: Diagnosis not present

## 2020-11-01 DIAGNOSIS — C61 Malignant neoplasm of prostate: Secondary | ICD-10-CM | POA: Diagnosis not present

## 2020-11-01 DIAGNOSIS — R350 Frequency of micturition: Secondary | ICD-10-CM | POA: Diagnosis not present

## 2020-11-01 DIAGNOSIS — R3912 Poor urinary stream: Secondary | ICD-10-CM | POA: Diagnosis not present

## 2020-11-24 DIAGNOSIS — E1142 Type 2 diabetes mellitus with diabetic polyneuropathy: Secondary | ICD-10-CM | POA: Diagnosis not present

## 2020-11-24 DIAGNOSIS — N401 Enlarged prostate with lower urinary tract symptoms: Secondary | ICD-10-CM | POA: Diagnosis not present

## 2020-11-24 DIAGNOSIS — I10 Essential (primary) hypertension: Secondary | ICD-10-CM | POA: Diagnosis not present

## 2020-11-24 DIAGNOSIS — D649 Anemia, unspecified: Secondary | ICD-10-CM | POA: Diagnosis not present

## 2020-11-24 DIAGNOSIS — R3911 Hesitancy of micturition: Secondary | ICD-10-CM | POA: Diagnosis not present

## 2020-11-24 DIAGNOSIS — E538 Deficiency of other specified B group vitamins: Secondary | ICD-10-CM | POA: Diagnosis not present

## 2020-11-28 DIAGNOSIS — D500 Iron deficiency anemia secondary to blood loss (chronic): Secondary | ICD-10-CM | POA: Diagnosis not present

## 2020-11-28 DIAGNOSIS — I10 Essential (primary) hypertension: Secondary | ICD-10-CM | POA: Diagnosis not present

## 2020-11-28 DIAGNOSIS — C61 Malignant neoplasm of prostate: Secondary | ICD-10-CM | POA: Diagnosis not present

## 2020-11-28 DIAGNOSIS — E7800 Pure hypercholesterolemia, unspecified: Secondary | ICD-10-CM | POA: Diagnosis not present

## 2020-11-28 DIAGNOSIS — E1165 Type 2 diabetes mellitus with hyperglycemia: Secondary | ICD-10-CM | POA: Diagnosis not present

## 2020-11-28 DIAGNOSIS — E11621 Type 2 diabetes mellitus with foot ulcer: Secondary | ICD-10-CM | POA: Diagnosis not present

## 2020-11-28 DIAGNOSIS — Z8546 Personal history of malignant neoplasm of prostate: Secondary | ICD-10-CM | POA: Diagnosis not present

## 2020-11-28 DIAGNOSIS — E1142 Type 2 diabetes mellitus with diabetic polyneuropathy: Secondary | ICD-10-CM | POA: Diagnosis not present

## 2020-12-15 ENCOUNTER — Other Ambulatory Visit: Payer: Self-pay

## 2020-12-15 ENCOUNTER — Encounter: Payer: Self-pay | Admitting: Podiatrist

## 2020-12-15 ENCOUNTER — Ambulatory Visit: Payer: Medicare HMO | Admitting: Podiatrist

## 2020-12-15 DIAGNOSIS — B351 Tinea unguium: Secondary | ICD-10-CM | POA: Diagnosis not present

## 2020-12-15 DIAGNOSIS — M79675 Pain in left toe(s): Secondary | ICD-10-CM | POA: Diagnosis not present

## 2020-12-15 DIAGNOSIS — M79674 Pain in right toe(s): Secondary | ICD-10-CM | POA: Diagnosis not present

## 2020-12-15 DIAGNOSIS — E1142 Type 2 diabetes mellitus with diabetic polyneuropathy: Secondary | ICD-10-CM | POA: Diagnosis not present

## 2020-12-15 NOTE — Progress Notes (Signed)
SUBJECTIVE: This patient presents to the office with chief complaint of long thick nails and diabetic foot check. This patient says there is no pain and discomfort in hisfeet however there is numbness and lack of sensation reported. This patient says there are long thick painful nails. These nails are painful walking and wearing shoes. there is no history of infection or drainage from both feet. Patient has history of foreign body injury left big toe.Patient is unable to self treat his own nails.    OBJECTIVE General Appearance Alert, conversant and in no acute stress.  Vascular Dorsalis pedis and posterior tibial pulses are weaklypalpable bilaterally. Capillary return is within normal limits bilaterally. Temperature is within normal limits bilaterally.  Neurologic Senn-Weinstein monofilament wire test diminished/absentbilaterally. Muscle power within normal limits bilaterally.  Nails Thick disfigured discolored nails with subungual debris from hallux to fifth toes bilaterally. No evidence of bacterial infection or drainage bilaterally.  Orthopedic No limitations of motion of motion feet . No crepitus or effusions noted. Prominent boney spurring present right foot medial cuneiform/ navicular region both dorsal and plantar. DJD 1st MPJ B/L. Deviation left hallux at IPJ.  Skin -No signs of infections or ulcers noted. hyperkeratotic callus present plantar left hallux-  Has some dried blood present but once pared, there is no underlying ulcer or wound noted.     Assessment:    ICD-10-CM   1. Pain due to onychomycosis of toenails of both feet  B35.1    M79.675    M79.674   2. Diabetic polyneuropathy associated with type 2 diabetes mellitus (HCC)  E11.42   3. Bone spur of left foot  M77.52   4. Bone spur of right foot  M77.51       Debride nails x 10. small callus debrided on the plantar aspect of the left great toe.  This was pared  with a 15 blade without incident.  He will return for a recheck and debridement in 3 months.

## 2020-12-20 DIAGNOSIS — Z794 Long term (current) use of insulin: Secondary | ICD-10-CM | POA: Diagnosis not present

## 2020-12-20 DIAGNOSIS — I10 Essential (primary) hypertension: Secondary | ICD-10-CM | POA: Diagnosis not present

## 2020-12-20 DIAGNOSIS — Z8546 Personal history of malignant neoplasm of prostate: Secondary | ICD-10-CM | POA: Diagnosis not present

## 2020-12-20 DIAGNOSIS — D500 Iron deficiency anemia secondary to blood loss (chronic): Secondary | ICD-10-CM | POA: Diagnosis not present

## 2020-12-20 DIAGNOSIS — C61 Malignant neoplasm of prostate: Secondary | ICD-10-CM | POA: Diagnosis not present

## 2020-12-20 DIAGNOSIS — E1165 Type 2 diabetes mellitus with hyperglycemia: Secondary | ICD-10-CM | POA: Diagnosis not present

## 2020-12-20 DIAGNOSIS — E7800 Pure hypercholesterolemia, unspecified: Secondary | ICD-10-CM | POA: Diagnosis not present

## 2020-12-20 DIAGNOSIS — E11621 Type 2 diabetes mellitus with foot ulcer: Secondary | ICD-10-CM | POA: Diagnosis not present

## 2020-12-20 DIAGNOSIS — Z7984 Long term (current) use of oral hypoglycemic drugs: Secondary | ICD-10-CM | POA: Diagnosis not present

## 2020-12-20 DIAGNOSIS — E1142 Type 2 diabetes mellitus with diabetic polyneuropathy: Secondary | ICD-10-CM | POA: Diagnosis not present

## 2021-01-17 DIAGNOSIS — I10 Essential (primary) hypertension: Secondary | ICD-10-CM | POA: Diagnosis not present

## 2021-01-17 DIAGNOSIS — E1142 Type 2 diabetes mellitus with diabetic polyneuropathy: Secondary | ICD-10-CM | POA: Diagnosis not present

## 2021-01-17 DIAGNOSIS — E1165 Type 2 diabetes mellitus with hyperglycemia: Secondary | ICD-10-CM | POA: Diagnosis not present

## 2021-01-17 DIAGNOSIS — E11621 Type 2 diabetes mellitus with foot ulcer: Secondary | ICD-10-CM | POA: Diagnosis not present

## 2021-01-17 DIAGNOSIS — C61 Malignant neoplasm of prostate: Secondary | ICD-10-CM | POA: Diagnosis not present

## 2021-01-17 DIAGNOSIS — E7800 Pure hypercholesterolemia, unspecified: Secondary | ICD-10-CM | POA: Diagnosis not present

## 2021-01-17 DIAGNOSIS — D500 Iron deficiency anemia secondary to blood loss (chronic): Secondary | ICD-10-CM | POA: Diagnosis not present

## 2021-01-17 DIAGNOSIS — Z8546 Personal history of malignant neoplasm of prostate: Secondary | ICD-10-CM | POA: Diagnosis not present

## 2021-03-02 DIAGNOSIS — C61 Malignant neoplasm of prostate: Secondary | ICD-10-CM | POA: Diagnosis not present

## 2021-03-02 DIAGNOSIS — E1165 Type 2 diabetes mellitus with hyperglycemia: Secondary | ICD-10-CM | POA: Diagnosis not present

## 2021-03-02 DIAGNOSIS — E7800 Pure hypercholesterolemia, unspecified: Secondary | ICD-10-CM | POA: Diagnosis not present

## 2021-03-02 DIAGNOSIS — E11621 Type 2 diabetes mellitus with foot ulcer: Secondary | ICD-10-CM | POA: Diagnosis not present

## 2021-03-02 DIAGNOSIS — E1142 Type 2 diabetes mellitus with diabetic polyneuropathy: Secondary | ICD-10-CM | POA: Diagnosis not present

## 2021-03-02 DIAGNOSIS — Z8546 Personal history of malignant neoplasm of prostate: Secondary | ICD-10-CM | POA: Diagnosis not present

## 2021-03-02 DIAGNOSIS — D500 Iron deficiency anemia secondary to blood loss (chronic): Secondary | ICD-10-CM | POA: Diagnosis not present

## 2021-03-02 DIAGNOSIS — I10 Essential (primary) hypertension: Secondary | ICD-10-CM | POA: Diagnosis not present

## 2021-03-15 DIAGNOSIS — I10 Essential (primary) hypertension: Secondary | ICD-10-CM | POA: Diagnosis not present

## 2021-03-21 ENCOUNTER — Ambulatory Visit: Payer: Medicare HMO | Admitting: Podiatry

## 2021-03-21 ENCOUNTER — Other Ambulatory Visit: Payer: Self-pay

## 2021-03-21 ENCOUNTER — Encounter: Payer: Self-pay | Admitting: Podiatry

## 2021-03-21 DIAGNOSIS — G5600 Carpal tunnel syndrome, unspecified upper limb: Secondary | ICD-10-CM | POA: Insufficient documentation

## 2021-03-21 DIAGNOSIS — Z8546 Personal history of malignant neoplasm of prostate: Secondary | ICD-10-CM | POA: Insufficient documentation

## 2021-03-21 DIAGNOSIS — E1165 Type 2 diabetes mellitus with hyperglycemia: Secondary | ICD-10-CM | POA: Insufficient documentation

## 2021-03-21 DIAGNOSIS — Z86010 Personal history of colonic polyps: Secondary | ICD-10-CM | POA: Insufficient documentation

## 2021-03-21 DIAGNOSIS — M79674 Pain in right toe(s): Secondary | ICD-10-CM

## 2021-03-21 DIAGNOSIS — E1142 Type 2 diabetes mellitus with diabetic polyneuropathy: Secondary | ICD-10-CM

## 2021-03-21 DIAGNOSIS — K279 Peptic ulcer, site unspecified, unspecified as acute or chronic, without hemorrhage or perforation: Secondary | ICD-10-CM | POA: Insufficient documentation

## 2021-03-21 DIAGNOSIS — N5201 Erectile dysfunction due to arterial insufficiency: Secondary | ICD-10-CM | POA: Insufficient documentation

## 2021-03-21 DIAGNOSIS — M79675 Pain in left toe(s): Secondary | ICD-10-CM | POA: Diagnosis not present

## 2021-03-21 DIAGNOSIS — B351 Tinea unguium: Secondary | ICD-10-CM | POA: Diagnosis not present

## 2021-03-21 DIAGNOSIS — R269 Unspecified abnormalities of gait and mobility: Secondary | ICD-10-CM | POA: Insufficient documentation

## 2021-03-21 DIAGNOSIS — D500 Iron deficiency anemia secondary to blood loss (chronic): Secondary | ICD-10-CM | POA: Insufficient documentation

## 2021-03-21 DIAGNOSIS — C61 Malignant neoplasm of prostate: Secondary | ICD-10-CM | POA: Insufficient documentation

## 2021-03-21 DIAGNOSIS — G4733 Obstructive sleep apnea (adult) (pediatric): Secondary | ICD-10-CM | POA: Insufficient documentation

## 2021-03-21 DIAGNOSIS — I10 Essential (primary) hypertension: Secondary | ICD-10-CM | POA: Insufficient documentation

## 2021-03-21 DIAGNOSIS — A048 Other specified bacterial intestinal infections: Secondary | ICD-10-CM | POA: Insufficient documentation

## 2021-03-21 DIAGNOSIS — Z6838 Body mass index (BMI) 38.0-38.9, adult: Secondary | ICD-10-CM | POA: Insufficient documentation

## 2021-03-21 DIAGNOSIS — E7800 Pure hypercholesterolemia, unspecified: Secondary | ICD-10-CM | POA: Insufficient documentation

## 2021-03-21 NOTE — Progress Notes (Signed)
This patient returns to my office for at risk foot care.  This patient requires this care by a professional since this patient will be at risk due to having diabetes. This patient is unable to cut nails himself since the patient cannot reach his nails.These nails are painful walking and wearing shoes.  This patient presents for at risk foot care today.  General Appearance  Alert, conversant and in no acute stress.  Vascular  Dorsalis pedis and posterior tibial  pulses are palpable  bilaterally.  Capillary return is within normal limits  bilaterally. Temperature is within normal limits  bilaterally.  Neurologic  Senn-Weinstein monofilament wire test within normal limits /diminished  bilaterally. Muscle power within normal limits bilaterally.  Nails Thick disfigured discolored nails with subungual debris  from hallux to fifth toes bilaterally. No evidence of bacterial infection or drainage bilaterally.  Orthopedic  No limitations of motion  feet .  No crepitus or effusions noted.  No bony pathology or digital deformities noted.  Hallux limitus 1st MPJ  B/L.  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  Skin  normotropic skin with no porokeratosis noted bilaterally.  No signs of infections or ulcers noted.     Onychomycosis  Pain in right toes  Pain in left toes  Consent was obtained for treatment procedures.   Mechanical debridement of nails 1-5  bilaterally performed with a nail nipper.  Filed with dremel without incident.    Return office visit    3 months                  Told patient to return for periodic foot care and evaluation due to potential at risk complications.   Gardiner Barefoot DPM

## 2021-03-30 DIAGNOSIS — Z794 Long term (current) use of insulin: Secondary | ICD-10-CM | POA: Diagnosis not present

## 2021-03-30 DIAGNOSIS — I10 Essential (primary) hypertension: Secondary | ICD-10-CM | POA: Diagnosis not present

## 2021-03-30 DIAGNOSIS — R3911 Hesitancy of micturition: Secondary | ICD-10-CM | POA: Diagnosis not present

## 2021-03-30 DIAGNOSIS — E1142 Type 2 diabetes mellitus with diabetic polyneuropathy: Secondary | ICD-10-CM | POA: Diagnosis not present

## 2021-03-30 DIAGNOSIS — N401 Enlarged prostate with lower urinary tract symptoms: Secondary | ICD-10-CM | POA: Diagnosis not present

## 2021-03-30 DIAGNOSIS — C61 Malignant neoplasm of prostate: Secondary | ICD-10-CM | POA: Diagnosis not present

## 2021-04-26 DIAGNOSIS — C61 Malignant neoplasm of prostate: Secondary | ICD-10-CM | POA: Diagnosis not present

## 2021-05-01 DIAGNOSIS — R3912 Poor urinary stream: Secondary | ICD-10-CM | POA: Diagnosis not present

## 2021-05-01 DIAGNOSIS — N401 Enlarged prostate with lower urinary tract symptoms: Secondary | ICD-10-CM | POA: Diagnosis not present

## 2021-05-01 DIAGNOSIS — C61 Malignant neoplasm of prostate: Secondary | ICD-10-CM | POA: Diagnosis not present

## 2021-05-01 DIAGNOSIS — R350 Frequency of micturition: Secondary | ICD-10-CM | POA: Diagnosis not present

## 2021-05-10 DIAGNOSIS — I10 Essential (primary) hypertension: Secondary | ICD-10-CM | POA: Diagnosis not present

## 2021-05-10 DIAGNOSIS — E7800 Pure hypercholesterolemia, unspecified: Secondary | ICD-10-CM | POA: Diagnosis not present

## 2021-05-10 DIAGNOSIS — E11621 Type 2 diabetes mellitus with foot ulcer: Secondary | ICD-10-CM | POA: Diagnosis not present

## 2021-05-10 DIAGNOSIS — C61 Malignant neoplasm of prostate: Secondary | ICD-10-CM | POA: Diagnosis not present

## 2021-05-10 DIAGNOSIS — N401 Enlarged prostate with lower urinary tract symptoms: Secondary | ICD-10-CM | POA: Diagnosis not present

## 2021-05-10 DIAGNOSIS — D500 Iron deficiency anemia secondary to blood loss (chronic): Secondary | ICD-10-CM | POA: Diagnosis not present

## 2021-05-10 DIAGNOSIS — E1165 Type 2 diabetes mellitus with hyperglycemia: Secondary | ICD-10-CM | POA: Diagnosis not present

## 2021-05-10 DIAGNOSIS — E1142 Type 2 diabetes mellitus with diabetic polyneuropathy: Secondary | ICD-10-CM | POA: Diagnosis not present

## 2021-05-16 DIAGNOSIS — I10 Essential (primary) hypertension: Secondary | ICD-10-CM | POA: Diagnosis not present

## 2021-06-22 ENCOUNTER — Ambulatory Visit (INDEPENDENT_AMBULATORY_CARE_PROVIDER_SITE_OTHER): Payer: Medicare HMO | Admitting: Podiatry

## 2021-06-22 ENCOUNTER — Encounter: Payer: Self-pay | Admitting: Podiatry

## 2021-06-22 ENCOUNTER — Other Ambulatory Visit: Payer: Self-pay

## 2021-06-22 DIAGNOSIS — M79675 Pain in left toe(s): Secondary | ICD-10-CM

## 2021-06-22 DIAGNOSIS — M7752 Other enthesopathy of left foot: Secondary | ICD-10-CM

## 2021-06-22 DIAGNOSIS — M79674 Pain in right toe(s): Secondary | ICD-10-CM

## 2021-06-22 DIAGNOSIS — E1142 Type 2 diabetes mellitus with diabetic polyneuropathy: Secondary | ICD-10-CM | POA: Diagnosis not present

## 2021-06-22 DIAGNOSIS — B351 Tinea unguium: Secondary | ICD-10-CM

## 2021-06-22 DIAGNOSIS — M7751 Other enthesopathy of right foot: Secondary | ICD-10-CM | POA: Diagnosis not present

## 2021-06-22 NOTE — Progress Notes (Signed)
This patient returns to my office for at risk foot care.  This patient requires this care by a professional since this patient will be at risk due to having diabetes. This patient is unable to cut nails himself since the patient cannot reach his nails.These nails are painful walking and wearing shoes.  This patient presents for at risk foot care today.  General Appearance  Alert, conversant and in no acute stress.  Vascular  Dorsalis pedis and posterior tibial  pulses are palpable  bilaterally.  Capillary return is within normal limits  bilaterally. Temperature is within normal limits  bilaterally.  Neurologic  Senn-Weinstein monofilament wire test within normal limits /diminished  bilaterally. Muscle power within normal limits bilaterally.  Nails Thick disfigured discolored nails with subungual debris  from hallux to fifth toes bilaterally. No evidence of bacterial infection or drainage bilaterally.  Orthopedic  No limitations of motion  feet .  No crepitus or effusions noted.  No bony pathology or digital deformities noted.  Hallux limitus 1st MPJ  B/L.  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 MCJ  B/L.  Skin  normotropic skin with no porokeratosis noted bilaterally.  No signs of infections or ulcers noted.     Onychomycosis  Pain in right toes  Pain in left toes  Consent was obtained for treatment procedures.   Mechanical debridement of nails 1-5  bilaterally performed with a nail nipper.  Filed with dremel without incident.    Return office visit    3 months                  Told patient to return for periodic foot care and evaluation due to potential at risk complications.   Gardiner Barefoot DPM

## 2021-07-16 DIAGNOSIS — N401 Enlarged prostate with lower urinary tract symptoms: Secondary | ICD-10-CM | POA: Diagnosis not present

## 2021-07-16 DIAGNOSIS — E7800 Pure hypercholesterolemia, unspecified: Secondary | ICD-10-CM | POA: Diagnosis not present

## 2021-07-16 DIAGNOSIS — E1165 Type 2 diabetes mellitus with hyperglycemia: Secondary | ICD-10-CM | POA: Diagnosis not present

## 2021-07-16 DIAGNOSIS — E1142 Type 2 diabetes mellitus with diabetic polyneuropathy: Secondary | ICD-10-CM | POA: Diagnosis not present

## 2021-07-16 DIAGNOSIS — E11621 Type 2 diabetes mellitus with foot ulcer: Secondary | ICD-10-CM | POA: Diagnosis not present

## 2021-07-16 DIAGNOSIS — D500 Iron deficiency anemia secondary to blood loss (chronic): Secondary | ICD-10-CM | POA: Diagnosis not present

## 2021-07-16 DIAGNOSIS — I10 Essential (primary) hypertension: Secondary | ICD-10-CM | POA: Diagnosis not present

## 2021-07-31 DIAGNOSIS — Z23 Encounter for immunization: Secondary | ICD-10-CM | POA: Diagnosis not present

## 2021-07-31 DIAGNOSIS — Z6838 Body mass index (BMI) 38.0-38.9, adult: Secondary | ICD-10-CM | POA: Diagnosis not present

## 2021-07-31 DIAGNOSIS — E1142 Type 2 diabetes mellitus with diabetic polyneuropathy: Secondary | ICD-10-CM | POA: Diagnosis not present

## 2021-07-31 DIAGNOSIS — Z0000 Encounter for general adult medical examination without abnormal findings: Secondary | ICD-10-CM | POA: Diagnosis not present

## 2021-07-31 DIAGNOSIS — D500 Iron deficiency anemia secondary to blood loss (chronic): Secondary | ICD-10-CM | POA: Diagnosis not present

## 2021-07-31 DIAGNOSIS — I10 Essential (primary) hypertension: Secondary | ICD-10-CM | POA: Diagnosis not present

## 2021-07-31 DIAGNOSIS — Z794 Long term (current) use of insulin: Secondary | ICD-10-CM | POA: Diagnosis not present

## 2021-07-31 DIAGNOSIS — Z1389 Encounter for screening for other disorder: Secondary | ICD-10-CM | POA: Diagnosis not present

## 2021-07-31 DIAGNOSIS — G4733 Obstructive sleep apnea (adult) (pediatric): Secondary | ICD-10-CM | POA: Diagnosis not present

## 2021-07-31 DIAGNOSIS — E7800 Pure hypercholesterolemia, unspecified: Secondary | ICD-10-CM | POA: Diagnosis not present

## 2021-07-31 DIAGNOSIS — C61 Malignant neoplasm of prostate: Secondary | ICD-10-CM | POA: Diagnosis not present

## 2021-09-26 ENCOUNTER — Encounter: Payer: Self-pay | Admitting: Podiatry

## 2021-09-26 ENCOUNTER — Other Ambulatory Visit: Payer: Self-pay

## 2021-09-26 ENCOUNTER — Ambulatory Visit (INDEPENDENT_AMBULATORY_CARE_PROVIDER_SITE_OTHER): Payer: Medicare HMO | Admitting: Podiatry

## 2021-09-26 DIAGNOSIS — B351 Tinea unguium: Secondary | ICD-10-CM

## 2021-09-26 DIAGNOSIS — M79675 Pain in left toe(s): Secondary | ICD-10-CM

## 2021-09-26 DIAGNOSIS — E1142 Type 2 diabetes mellitus with diabetic polyneuropathy: Secondary | ICD-10-CM

## 2021-09-26 DIAGNOSIS — M79674 Pain in right toe(s): Secondary | ICD-10-CM

## 2021-09-26 NOTE — Progress Notes (Signed)
This patient returns to my office for at risk foot care.  This patient requires this care by a professional since this patient will be at risk due to having diabetes. This patient is unable to cut nails himself since the patient cannot reach his nails.These nails are painful walking and wearing shoes.  This patient presents for at risk foot care today.  General Appearance  Alert, conversant and in no acute stress.  Vascular  Dorsalis pedis and posterior tibial  pulses are weakly palpable  bilaterally.  Capillary return is within normal limits  bilaterally. Temperature is within normal limits  bilaterally.  Neurologic  Senn-Weinstein monofilament wire test within diminished/absent  bilaterally. Muscle power within normal limits bilaterally.  Nails Thick disfigured discolored nails with subungual debris  from hallux to fifth toes bilaterally. No evidence of bacterial infection or drainage bilaterally.  Orthopedic  No limitations of motion  feet .  No crepitus or effusions noted.  No bony pathology or digital deformities noted.  Hallux limitus 1st MPJ  B/L.  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 MCJ  B/L. Hammer toes  B/L.  Skin  normotropic skin with no porokeratosis noted bilaterally.  No signs of infections or ulcers noted.     Onychomycosis  Pain in right toes  Pain in left toes  Consent was obtained for treatment procedures.   Mechanical debridement of nails 1-5  bilaterally performed with a nail nipper.  Filed with dremel without incident.  Diabetic foot exam performed.   Return office visit    3 months                  Told patient to return for periodic foot care and evaluation due to potential at risk complications.   Gardiner Barefoot DPM

## 2021-10-12 DIAGNOSIS — N401 Enlarged prostate with lower urinary tract symptoms: Secondary | ICD-10-CM | POA: Diagnosis not present

## 2021-10-12 DIAGNOSIS — E11621 Type 2 diabetes mellitus with foot ulcer: Secondary | ICD-10-CM | POA: Diagnosis not present

## 2021-10-12 DIAGNOSIS — I10 Essential (primary) hypertension: Secondary | ICD-10-CM | POA: Diagnosis not present

## 2021-10-12 DIAGNOSIS — E1142 Type 2 diabetes mellitus with diabetic polyneuropathy: Secondary | ICD-10-CM | POA: Diagnosis not present

## 2021-10-12 DIAGNOSIS — D500 Iron deficiency anemia secondary to blood loss (chronic): Secondary | ICD-10-CM | POA: Diagnosis not present

## 2021-10-12 DIAGNOSIS — E1165 Type 2 diabetes mellitus with hyperglycemia: Secondary | ICD-10-CM | POA: Diagnosis not present

## 2021-10-12 DIAGNOSIS — C61 Malignant neoplasm of prostate: Secondary | ICD-10-CM | POA: Diagnosis not present

## 2021-10-12 DIAGNOSIS — E7800 Pure hypercholesterolemia, unspecified: Secondary | ICD-10-CM | POA: Diagnosis not present

## 2021-10-22 DIAGNOSIS — C61 Malignant neoplasm of prostate: Secondary | ICD-10-CM | POA: Diagnosis not present

## 2021-11-14 DIAGNOSIS — C61 Malignant neoplasm of prostate: Secondary | ICD-10-CM | POA: Diagnosis not present

## 2021-11-14 DIAGNOSIS — N401 Enlarged prostate with lower urinary tract symptoms: Secondary | ICD-10-CM | POA: Diagnosis not present

## 2021-11-14 DIAGNOSIS — R3912 Poor urinary stream: Secondary | ICD-10-CM | POA: Diagnosis not present

## 2021-11-27 DIAGNOSIS — H5200 Hypermetropia, unspecified eye: Secondary | ICD-10-CM | POA: Diagnosis not present

## 2021-11-27 DIAGNOSIS — N401 Enlarged prostate with lower urinary tract symptoms: Secondary | ICD-10-CM | POA: Diagnosis not present

## 2021-11-27 DIAGNOSIS — E1142 Type 2 diabetes mellitus with diabetic polyneuropathy: Secondary | ICD-10-CM | POA: Diagnosis not present

## 2021-11-27 DIAGNOSIS — E7800 Pure hypercholesterolemia, unspecified: Secondary | ICD-10-CM | POA: Diagnosis not present

## 2021-11-27 DIAGNOSIS — I10 Essential (primary) hypertension: Secondary | ICD-10-CM | POA: Diagnosis not present

## 2021-11-27 DIAGNOSIS — E119 Type 2 diabetes mellitus without complications: Secondary | ICD-10-CM | POA: Diagnosis not present

## 2021-11-27 DIAGNOSIS — E113213 Type 2 diabetes mellitus with mild nonproliferative diabetic retinopathy with macular edema, bilateral: Secondary | ICD-10-CM | POA: Diagnosis not present

## 2021-11-27 DIAGNOSIS — Z0100 Encounter for examination of eyes and vision without abnormal findings: Secondary | ICD-10-CM | POA: Diagnosis not present

## 2021-12-11 DIAGNOSIS — E1142 Type 2 diabetes mellitus with diabetic polyneuropathy: Secondary | ICD-10-CM | POA: Diagnosis not present

## 2021-12-11 DIAGNOSIS — I10 Essential (primary) hypertension: Secondary | ICD-10-CM | POA: Diagnosis not present

## 2021-12-14 ENCOUNTER — Ambulatory Visit: Payer: Medicare HMO | Admitting: Podiatry

## 2022-01-02 DIAGNOSIS — E1142 Type 2 diabetes mellitus with diabetic polyneuropathy: Secondary | ICD-10-CM | POA: Diagnosis not present

## 2022-01-02 DIAGNOSIS — N401 Enlarged prostate with lower urinary tract symptoms: Secondary | ICD-10-CM | POA: Diagnosis not present

## 2022-01-02 DIAGNOSIS — E7800 Pure hypercholesterolemia, unspecified: Secondary | ICD-10-CM | POA: Diagnosis not present

## 2022-01-02 DIAGNOSIS — I10 Essential (primary) hypertension: Secondary | ICD-10-CM | POA: Diagnosis not present

## 2022-01-11 ENCOUNTER — Ambulatory Visit (INDEPENDENT_AMBULATORY_CARE_PROVIDER_SITE_OTHER): Payer: Medicare HMO | Admitting: Podiatry

## 2022-01-11 ENCOUNTER — Encounter: Payer: Self-pay | Admitting: Podiatry

## 2022-01-11 DIAGNOSIS — M79675 Pain in left toe(s): Secondary | ICD-10-CM

## 2022-01-11 DIAGNOSIS — E1142 Type 2 diabetes mellitus with diabetic polyneuropathy: Secondary | ICD-10-CM | POA: Diagnosis not present

## 2022-01-11 DIAGNOSIS — B351 Tinea unguium: Secondary | ICD-10-CM | POA: Diagnosis not present

## 2022-01-11 DIAGNOSIS — M79674 Pain in right toe(s): Secondary | ICD-10-CM

## 2022-01-11 NOTE — Progress Notes (Signed)
This patient returns to my office for at risk foot care.  This patient requires this care by a professional since this patient will be at risk due to having diabetes. This patient is unable to cut nails himself since the patient cannot reach his nails.These nails are painful walking and wearing shoes.  This patient presents for at risk foot care today.  General Appearance  Alert, conversant and in no acute stress.  Vascular  Dorsalis pedis and posterior tibial  pulses are weakly palpable  bilaterally.  Capillary return is within normal limits  bilaterally. Temperature is within normal limits  bilaterally.  Neurologic  Senn-Weinstein monofilament wire test within diminished/absent  bilaterally. Muscle power within normal limits bilaterally.  Nails Thick disfigured discolored nails with subungual debris  from hallux to fifth toes bilaterally. No evidence of bacterial infection or drainage bilaterally.  Orthopedic  No limitations of motion  feet .  No crepitus or effusions noted.  No bony pathology or digital deformities noted.  Hallux limitus 1st MPJ  B/L.  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 MCJ  B/L. Hammer toes  B/L.  IPJ fused secondary to infection.  Skin  normotropic skin with no porokeratosis noted bilaterally.  No signs of infections or ulcers noted.     Onychomycosis  Pain in right toes  Pain in left toes  Consent was obtained for treatment procedures.   Mechanical debridement of nails 1-5  bilaterally performed with a nail nipper.  Filed with dremel without incident.     Return office visit    3 months                  Told patient to return for periodic foot care and evaluation due to potential at risk complications.   Bernadine Melecio DPM   

## 2022-01-24 DIAGNOSIS — I10 Essential (primary) hypertension: Secondary | ICD-10-CM | POA: Diagnosis not present

## 2022-01-24 DIAGNOSIS — E1142 Type 2 diabetes mellitus with diabetic polyneuropathy: Secondary | ICD-10-CM | POA: Diagnosis not present

## 2022-01-24 DIAGNOSIS — N401 Enlarged prostate with lower urinary tract symptoms: Secondary | ICD-10-CM | POA: Diagnosis not present

## 2022-01-24 DIAGNOSIS — E7800 Pure hypercholesterolemia, unspecified: Secondary | ICD-10-CM | POA: Diagnosis not present

## 2022-03-28 DIAGNOSIS — E1142 Type 2 diabetes mellitus with diabetic polyneuropathy: Secondary | ICD-10-CM | POA: Diagnosis not present

## 2022-03-28 DIAGNOSIS — I10 Essential (primary) hypertension: Secondary | ICD-10-CM | POA: Diagnosis not present

## 2022-03-28 DIAGNOSIS — E1165 Type 2 diabetes mellitus with hyperglycemia: Secondary | ICD-10-CM | POA: Diagnosis not present

## 2022-03-28 DIAGNOSIS — N401 Enlarged prostate with lower urinary tract symptoms: Secondary | ICD-10-CM | POA: Diagnosis not present

## 2022-03-28 DIAGNOSIS — E113213 Type 2 diabetes mellitus with mild nonproliferative diabetic retinopathy with macular edema, bilateral: Secondary | ICD-10-CM | POA: Diagnosis not present

## 2022-03-29 ENCOUNTER — Ambulatory Visit (INDEPENDENT_AMBULATORY_CARE_PROVIDER_SITE_OTHER): Payer: Medicare HMO | Admitting: Podiatry

## 2022-03-29 ENCOUNTER — Encounter: Payer: Self-pay | Admitting: Podiatry

## 2022-03-29 DIAGNOSIS — M79675 Pain in left toe(s): Secondary | ICD-10-CM

## 2022-03-29 DIAGNOSIS — B351 Tinea unguium: Secondary | ICD-10-CM

## 2022-03-29 DIAGNOSIS — M79674 Pain in right toe(s): Secondary | ICD-10-CM | POA: Diagnosis not present

## 2022-03-29 DIAGNOSIS — E1142 Type 2 diabetes mellitus with diabetic polyneuropathy: Secondary | ICD-10-CM | POA: Diagnosis not present

## 2022-03-29 DIAGNOSIS — M7752 Other enthesopathy of left foot: Secondary | ICD-10-CM

## 2022-03-29 DIAGNOSIS — M7751 Other enthesopathy of right foot: Secondary | ICD-10-CM

## 2022-03-29 NOTE — Progress Notes (Signed)
This patient returns to my office for at risk foot care.  This patient requires this care by a professional since this patient will be at risk due to having diabetes. This patient is unable to cut nails himself since the patient cannot reach his nails.These nails are painful walking and wearing shoes.  This patient presents for at risk foot care today.  General Appearance  Alert, conversant and in no acute stress.  Vascular  Dorsalis pedis and posterior tibial  pulses are weakly palpable  bilaterally.  Capillary return is within normal limits  bilaterally. Temperature is within normal limits  bilaterally.  Neurologic  Senn-Weinstein monofilament wire test within diminished/absent  bilaterally. Muscle power within normal limits bilaterally.  Nails Thick disfigured discolored nails with subungual debris  from hallux to fifth toes bilaterally. No evidence of bacterial infection or drainage bilaterally.  Orthopedic  No limitations of motion  feet .  No crepitus or effusions noted.  No bony pathology or digital deformities noted.  Hallux limitus 1st MPJ  B/L.  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 MCJ  B/L. Hammer toes  B/L.  IPJ fused secondary to infection.  Skin  normotropic skin with no porokeratosis noted bilaterally.  No signs of infections or ulcers noted.     Onychomycosis  Pain in right toes  Pain in left toes  Consent was obtained for treatment procedures.   Mechanical debridement of nails 1-5  bilaterally performed with a nail nipper.  Filed with dremel without incident.     Return office visit    3 months                  Told patient to return for periodic foot care and evaluation due to potential at risk complications.   Sima Lindenberger DPM   

## 2022-05-22 DIAGNOSIS — C61 Malignant neoplasm of prostate: Secondary | ICD-10-CM | POA: Diagnosis not present

## 2022-05-30 DIAGNOSIS — E7800 Pure hypercholesterolemia, unspecified: Secondary | ICD-10-CM | POA: Diagnosis not present

## 2022-05-30 DIAGNOSIS — I10 Essential (primary) hypertension: Secondary | ICD-10-CM | POA: Diagnosis not present

## 2022-05-30 DIAGNOSIS — N401 Enlarged prostate with lower urinary tract symptoms: Secondary | ICD-10-CM | POA: Diagnosis not present

## 2022-05-30 DIAGNOSIS — E1142 Type 2 diabetes mellitus with diabetic polyneuropathy: Secondary | ICD-10-CM | POA: Diagnosis not present

## 2022-07-05 ENCOUNTER — Encounter: Payer: Self-pay | Admitting: Podiatry

## 2022-07-05 ENCOUNTER — Ambulatory Visit: Payer: Medicare HMO | Admitting: Podiatry

## 2022-07-05 DIAGNOSIS — B351 Tinea unguium: Secondary | ICD-10-CM

## 2022-07-05 DIAGNOSIS — M7752 Other enthesopathy of left foot: Secondary | ICD-10-CM

## 2022-07-05 DIAGNOSIS — M79675 Pain in left toe(s): Secondary | ICD-10-CM | POA: Diagnosis not present

## 2022-07-05 DIAGNOSIS — E1142 Type 2 diabetes mellitus with diabetic polyneuropathy: Secondary | ICD-10-CM | POA: Diagnosis not present

## 2022-07-05 DIAGNOSIS — M79674 Pain in right toe(s): Secondary | ICD-10-CM

## 2022-07-05 DIAGNOSIS — M7751 Other enthesopathy of right foot: Secondary | ICD-10-CM

## 2022-07-05 NOTE — Progress Notes (Signed)
This patient returns to my office for at risk foot care.  This patient requires this care by a professional since this patient will be at risk due to having diabetes. This patient is unable to cut nails himself since the patient cannot reach his nails.These nails are painful walking and wearing shoes.  This patient presents for at risk foot care today.  General Appearance  Alert, conversant and in no acute stress.  Vascular  Dorsalis pedis and posterior tibial  pulses are weakly palpable  bilaterally.  Capillary return is within normal limits  bilaterally. Temperature is within normal limits  bilaterally.  Neurologic  Senn-Weinstein monofilament wire test within diminished/absent  bilaterally. Muscle power within normal limits bilaterally.  Nails Thick disfigured discolored nails with subungual debris  from hallux to fifth toes bilaterally. No evidence of bacterial infection or drainage bilaterally.  Orthopedic  No limitations of motion  feet .  No crepitus or effusions noted.  No bony pathology or digital deformities noted.  Hallux limitus 1st MPJ  B/L.  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 MCJ  B/L. Hammer toes  B/L.  IPJ fused secondary to infection.  Skin  normotropic skin with no porokeratosis noted bilaterally.  No signs of infections or ulcers noted.     Onychomycosis  Pain in right toes  Pain in left toes  Consent was obtained for treatment procedures.   Mechanical debridement of nails 1-5  bilaterally performed with a nail nipper.  Filed with dremel without incident.     Return office visit    3 months                  Told patient to return for periodic foot care and evaluation due to potential at risk complications.   Chantalle Defilippo DPM   

## 2022-07-12 DIAGNOSIS — I10 Essential (primary) hypertension: Secondary | ICD-10-CM | POA: Diagnosis not present

## 2022-07-12 DIAGNOSIS — N401 Enlarged prostate with lower urinary tract symptoms: Secondary | ICD-10-CM | POA: Diagnosis not present

## 2022-07-12 DIAGNOSIS — E1142 Type 2 diabetes mellitus with diabetic polyneuropathy: Secondary | ICD-10-CM | POA: Diagnosis not present

## 2022-09-02 DIAGNOSIS — Z1331 Encounter for screening for depression: Secondary | ICD-10-CM | POA: Diagnosis not present

## 2022-09-02 DIAGNOSIS — D500 Iron deficiency anemia secondary to blood loss (chronic): Secondary | ICD-10-CM | POA: Diagnosis not present

## 2022-09-02 DIAGNOSIS — E7800 Pure hypercholesterolemia, unspecified: Secondary | ICD-10-CM | POA: Diagnosis not present

## 2022-09-02 DIAGNOSIS — Z23 Encounter for immunization: Secondary | ICD-10-CM | POA: Diagnosis not present

## 2022-09-02 DIAGNOSIS — E1142 Type 2 diabetes mellitus with diabetic polyneuropathy: Secondary | ICD-10-CM | POA: Diagnosis not present

## 2022-09-02 DIAGNOSIS — G4733 Obstructive sleep apnea (adult) (pediatric): Secondary | ICD-10-CM | POA: Diagnosis not present

## 2022-09-02 DIAGNOSIS — E1165 Type 2 diabetes mellitus with hyperglycemia: Secondary | ICD-10-CM | POA: Diagnosis not present

## 2022-09-02 DIAGNOSIS — C61 Malignant neoplasm of prostate: Secondary | ICD-10-CM | POA: Diagnosis not present

## 2022-09-02 DIAGNOSIS — Z86010 Personal history of colonic polyps: Secondary | ICD-10-CM | POA: Diagnosis not present

## 2022-09-02 DIAGNOSIS — I10 Essential (primary) hypertension: Secondary | ICD-10-CM | POA: Diagnosis not present

## 2022-09-02 DIAGNOSIS — N5201 Erectile dysfunction due to arterial insufficiency: Secondary | ICD-10-CM | POA: Diagnosis not present

## 2022-09-02 DIAGNOSIS — Z0000 Encounter for general adult medical examination without abnormal findings: Secondary | ICD-10-CM | POA: Diagnosis not present

## 2022-09-02 DIAGNOSIS — N401 Enlarged prostate with lower urinary tract symptoms: Secondary | ICD-10-CM | POA: Diagnosis not present

## 2022-10-02 DIAGNOSIS — E1142 Type 2 diabetes mellitus with diabetic polyneuropathy: Secondary | ICD-10-CM | POA: Diagnosis not present

## 2022-10-02 DIAGNOSIS — N401 Enlarged prostate with lower urinary tract symptoms: Secondary | ICD-10-CM | POA: Diagnosis not present

## 2022-10-02 DIAGNOSIS — I10 Essential (primary) hypertension: Secondary | ICD-10-CM | POA: Diagnosis not present

## 2022-10-02 DIAGNOSIS — D500 Iron deficiency anemia secondary to blood loss (chronic): Secondary | ICD-10-CM | POA: Diagnosis not present

## 2022-10-09 ENCOUNTER — Encounter: Payer: Self-pay | Admitting: Podiatry

## 2022-10-09 ENCOUNTER — Ambulatory Visit: Payer: Medicare HMO | Admitting: Podiatry

## 2022-10-09 DIAGNOSIS — M79675 Pain in left toe(s): Secondary | ICD-10-CM

## 2022-10-09 DIAGNOSIS — E1142 Type 2 diabetes mellitus with diabetic polyneuropathy: Secondary | ICD-10-CM

## 2022-10-09 DIAGNOSIS — M79674 Pain in right toe(s): Secondary | ICD-10-CM

## 2022-10-09 DIAGNOSIS — B351 Tinea unguium: Secondary | ICD-10-CM

## 2022-10-09 NOTE — Progress Notes (Signed)
This patient returns to my office for at risk foot care.  This patient requires this care by a professional since this patient will be at risk due to having diabetes. This patient is unable to cut nails himself since the patient cannot reach his nails.These nails are painful walking and wearing shoes.  This patient presents for at risk foot care today.  General Appearance  Alert, conversant and in no acute stress.  Vascular  Dorsalis pedis and posterior tibial  pulses are weakly palpable  bilaterally.  Capillary return is within normal limits  bilaterally. Temperature is within normal limits  bilaterally.  Neurologic  Senn-Weinstein monofilament wire test within diminished/absent  bilaterally. Muscle power within normal limits bilaterally.  Nails Thick disfigured discolored nails with subungual debris  from hallux to fifth toes bilaterally. No evidence of bacterial infection or drainage bilaterally.  Orthopedic  No limitations of motion  feet .  No crepitus or effusions noted.  No bony pathology or digital deformities noted.  Hallux limitus 1st MPJ  B/L.  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 MCJ  B/L. Hammer toes  B/L.  IPJ fused secondary to infection.  Skin  normotropic skin with no porokeratosis noted bilaterally.  No signs of infections or ulcers noted.     Onychomycosis  Pain in right toes  Pain in left toes  Consent was obtained for treatment procedures.   Mechanical debridement of nails 1-5  bilaterally performed with a nail nipper.  Filed with dremel without incident.     Return office visit    3 months                  Told patient to return for periodic foot care and evaluation due to potential at risk complications.   Gardiner Barefoot DPM

## 2022-12-27 ENCOUNTER — Encounter: Payer: Self-pay | Admitting: Podiatry

## 2022-12-31 DIAGNOSIS — Z794 Long term (current) use of insulin: Secondary | ICD-10-CM | POA: Diagnosis not present

## 2022-12-31 DIAGNOSIS — E1142 Type 2 diabetes mellitus with diabetic polyneuropathy: Secondary | ICD-10-CM | POA: Diagnosis not present

## 2022-12-31 DIAGNOSIS — E7800 Pure hypercholesterolemia, unspecified: Secondary | ICD-10-CM | POA: Diagnosis not present

## 2022-12-31 DIAGNOSIS — Z86010 Personal history of colonic polyps: Secondary | ICD-10-CM | POA: Diagnosis not present

## 2022-12-31 DIAGNOSIS — I10 Essential (primary) hypertension: Secondary | ICD-10-CM | POA: Diagnosis not present

## 2022-12-31 DIAGNOSIS — E1165 Type 2 diabetes mellitus with hyperglycemia: Secondary | ICD-10-CM | POA: Diagnosis not present

## 2023-01-10 ENCOUNTER — Ambulatory Visit: Payer: Medicare HMO | Admitting: Podiatry

## 2023-01-15 ENCOUNTER — Ambulatory Visit (INDEPENDENT_AMBULATORY_CARE_PROVIDER_SITE_OTHER): Payer: Medicare HMO | Admitting: Podiatry

## 2023-01-15 ENCOUNTER — Encounter: Payer: Self-pay | Admitting: Podiatry

## 2023-01-15 DIAGNOSIS — M79675 Pain in left toe(s): Secondary | ICD-10-CM | POA: Diagnosis not present

## 2023-01-15 DIAGNOSIS — N179 Acute kidney failure, unspecified: Secondary | ICD-10-CM | POA: Diagnosis not present

## 2023-01-15 DIAGNOSIS — B351 Tinea unguium: Secondary | ICD-10-CM | POA: Diagnosis not present

## 2023-01-15 DIAGNOSIS — M79674 Pain in right toe(s): Secondary | ICD-10-CM | POA: Diagnosis not present

## 2023-01-15 DIAGNOSIS — E1142 Type 2 diabetes mellitus with diabetic polyneuropathy: Secondary | ICD-10-CM | POA: Diagnosis not present

## 2023-01-15 DIAGNOSIS — W450XXA Nail entering through skin, initial encounter: Secondary | ICD-10-CM | POA: Diagnosis not present

## 2023-01-15 NOTE — Progress Notes (Addendum)
This patient returns to my office for at risk foot care.  This patient requires this care by a professional since this patient will be at risk due to having diabetes. This patient is unable to cut nails himself since the patient cannot reach his nails.These nails are painful walking and wearing shoes. He also says he injured his third toenail on his right foot and the nail is coming off.  This patient presents for at risk foot care today.  General Appearance  Alert, conversant and in no acute stress.  Vascular  Dorsalis pedis and posterior tibial  pulses are weakly palpable  bilaterally.  Capillary return is within normal limits  bilaterally. Temperature is within normal limits  bilaterally.  Neurologic  Senn-Weinstein monofilament wire test within diminished/absent  bilaterally. Muscle power within normal limits bilaterally.  Nails Thick disfigured discolored nails with subungual debris  from hallux to fifth toes bilaterally. No evidence of bacterial infection or drainage bilaterally. Third toenail right foot is 90% avulsed with no redness or drainage .  No infection.  Orthopedic  No limitations of motion  feet .  No crepitus or effusions noted.  No bony pathology or digital deformities noted.  Hallux limitus 1st MPJ  B/L.  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 MCJ  B/L. Hammer toes  B/L.  IPJ fused secondary to infection.  Skin  normotropic skin with no porokeratosis noted bilaterally.  No signs of infections or ulcers noted.     Onychomycosis  Pain in right toes  Pain in left toes  Consent was obtained for treatment procedures.   Mechanical debridement of nails 1-5  bilaterally performed with a nail nipper.  Filed with dremel without incident.  Removed third toenail right foot followed by DSD.   Return office visit    3 months                  Told patient to return for periodic foot care and evaluation due to potential at risk complications.   Helane Gunther DPM

## 2023-04-17 ENCOUNTER — Encounter: Payer: Self-pay | Admitting: Podiatry

## 2023-04-17 ENCOUNTER — Ambulatory Visit: Payer: Medicare HMO | Admitting: Podiatry

## 2023-04-17 DIAGNOSIS — E1142 Type 2 diabetes mellitus with diabetic polyneuropathy: Secondary | ICD-10-CM | POA: Diagnosis not present

## 2023-04-17 DIAGNOSIS — T148XXA Other injury of unspecified body region, initial encounter: Secondary | ICD-10-CM | POA: Diagnosis not present

## 2023-04-17 DIAGNOSIS — M79675 Pain in left toe(s): Secondary | ICD-10-CM

## 2023-04-17 DIAGNOSIS — M79674 Pain in right toe(s): Secondary | ICD-10-CM | POA: Diagnosis not present

## 2023-04-17 DIAGNOSIS — B351 Tinea unguium: Secondary | ICD-10-CM | POA: Diagnosis not present

## 2023-04-17 NOTE — Progress Notes (Signed)
This patient returns to my office for at risk foot care.  This patient requires this care by a professional since this patient will be at risk due to having diabetes. This patient is unable to cut nails himself since the patient cannot reach his nails.These nails are painful walking and wearing shoes. He also says he has developed this sore on his left big toe and is unaware how it developed.  This patient presents for at risk foot care today.  General Appearance  Alert, conversant and in no acute stress.  Vascular  Dorsalis pedis and posterior tibial  pulses are weakly palpable  bilaterally.  Capillary return is within normal limits  bilaterally. Temperature is within normal limits  bilaterally.  Neurologic  Senn-Weinstein monofilament wire test within diminished/absent  bilaterally. Muscle power within normal limits bilaterally.  Nails Thick disfigured discolored nails with subungual debris  from hallux to fifth toes bilaterally.   Orthopedic  No limitations of motion  feet .  No crepitus or effusions noted.  No bony pathology or digital deformities noted.  Hallux limitus 1st MPJ  B/L.  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 MCJ  B/L. Hammer toes  B/L.  IPJ fused secondary to infection.  Skin  normotropic skin with no porokeratosis noted bilaterally.  No signs of infections or ulcers noted.   Healing blister on dorsum IPJ left hallux.  Onychomycosis  Pain in right toes  Pain in left toes  Blister left hallux.  Consent was obtained for treatment procedures.   Mechanical debridement of nails 1-5  bilaterally performed with a nail nipper.  Filed with dremel without incident.  Neosporin/DSD left hallux.  Told to peroxide at home until healed.  No drainage noted.   Return office visit    3 months                  Told patient to return for periodic foot care and evaluation due to potential at risk complications.   Helane Gunther DPM

## 2023-05-01 DIAGNOSIS — I10 Essential (primary) hypertension: Secondary | ICD-10-CM | POA: Diagnosis not present

## 2023-05-01 DIAGNOSIS — E113213 Type 2 diabetes mellitus with mild nonproliferative diabetic retinopathy with macular edema, bilateral: Secondary | ICD-10-CM | POA: Diagnosis not present

## 2023-05-01 DIAGNOSIS — E1142 Type 2 diabetes mellitus with diabetic polyneuropathy: Secondary | ICD-10-CM | POA: Diagnosis not present

## 2023-05-01 DIAGNOSIS — Z86010 Personal history of colonic polyps: Secondary | ICD-10-CM | POA: Diagnosis not present

## 2023-05-01 DIAGNOSIS — Z794 Long term (current) use of insulin: Secondary | ICD-10-CM | POA: Diagnosis not present

## 2023-05-22 DIAGNOSIS — C61 Malignant neoplasm of prostate: Secondary | ICD-10-CM | POA: Diagnosis not present

## 2023-05-29 DIAGNOSIS — R3912 Poor urinary stream: Secondary | ICD-10-CM | POA: Diagnosis not present

## 2023-05-29 DIAGNOSIS — C61 Malignant neoplasm of prostate: Secondary | ICD-10-CM | POA: Diagnosis not present

## 2023-05-29 DIAGNOSIS — N401 Enlarged prostate with lower urinary tract symptoms: Secondary | ICD-10-CM | POA: Diagnosis not present

## 2023-05-30 NOTE — Addendum Note (Signed)
Addended by: Helane Gunther on: 05/30/2023 08:00 AM   Modules accepted: Level of Service

## 2023-07-18 ENCOUNTER — Encounter: Payer: Self-pay | Admitting: Podiatry

## 2023-07-18 ENCOUNTER — Ambulatory Visit (INDEPENDENT_AMBULATORY_CARE_PROVIDER_SITE_OTHER): Payer: Medicare HMO | Admitting: Podiatry

## 2023-07-18 DIAGNOSIS — M79674 Pain in right toe(s): Secondary | ICD-10-CM | POA: Diagnosis not present

## 2023-07-18 DIAGNOSIS — E1142 Type 2 diabetes mellitus with diabetic polyneuropathy: Secondary | ICD-10-CM

## 2023-07-18 DIAGNOSIS — M79675 Pain in left toe(s): Secondary | ICD-10-CM | POA: Diagnosis not present

## 2023-07-18 DIAGNOSIS — B351 Tinea unguium: Secondary | ICD-10-CM

## 2023-07-18 NOTE — Progress Notes (Signed)
This patient returns to my office for at risk foot care.  This patient requires this care by a professional since this patient will be at risk due to having diabetes. This patient is unable to cut nails himself since the patient cannot reach his nails.These nails are painful walking and wearing shoes. He also says he has developed this sore on his left big toe and is unaware how it developed.  This patient presents for at risk foot care today.  General Appearance  Alert, conversant and in no acute stress.  Vascular  Dorsalis pedis and posterior tibial  pulses are weakly palpable  bilaterally.  Capillary return is within normal limits  bilaterally. Temperature is within normal limits  bilaterally.  Neurologic  Senn-Weinstein monofilament wire test within diminished/absent  bilaterally. Muscle power within normal limits bilaterally.  Nails Thick disfigured discolored nails with subungual debris  from hallux to fifth toes bilaterally.   Orthopedic  No limitations of motion  feet .  No crepitus or effusions noted.  No bony pathology or digital deformities noted.  Hallux limitus 1st MPJ  B/L.  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 MCJ  B/L. Hammer toes  B/L.  IPJ fused secondary to infection.  Skin  normotropic skin with no porokeratosis noted bilaterally.  No signs of infections or ulcers noted.   Healing blister on dorsum IPJ left hallux.  Onychomycosis  Pain in right toes  Pain in left toes  Blister left hallux.  Consent was obtained for treatment procedures.   Mechanical debridement of nails 1-5  bilaterally performed with a nail nipper.  Filed with dremel without incident.  Neosporin/DSD left hallux.  Told to peroxide at home until healed.  No drainage noted.   Return office visit    3 months                  Told patient to return for periodic foot care and evaluation due to potential at risk complications.   Helane Gunther DPM

## 2023-09-04 DIAGNOSIS — E113213 Type 2 diabetes mellitus with mild nonproliferative diabetic retinopathy with macular edema, bilateral: Secondary | ICD-10-CM | POA: Diagnosis not present

## 2023-09-04 DIAGNOSIS — Z79899 Other long term (current) drug therapy: Secondary | ICD-10-CM | POA: Diagnosis not present

## 2023-09-04 DIAGNOSIS — C61 Malignant neoplasm of prostate: Secondary | ICD-10-CM | POA: Diagnosis not present

## 2023-09-04 DIAGNOSIS — Z0000 Encounter for general adult medical examination without abnormal findings: Secondary | ICD-10-CM | POA: Diagnosis not present

## 2023-09-04 DIAGNOSIS — I10 Essential (primary) hypertension: Secondary | ICD-10-CM | POA: Diagnosis not present

## 2023-09-04 DIAGNOSIS — D500 Iron deficiency anemia secondary to blood loss (chronic): Secondary | ICD-10-CM | POA: Diagnosis not present

## 2023-09-04 DIAGNOSIS — Z794 Long term (current) use of insulin: Secondary | ICD-10-CM | POA: Diagnosis not present

## 2023-09-04 DIAGNOSIS — E1142 Type 2 diabetes mellitus with diabetic polyneuropathy: Secondary | ICD-10-CM | POA: Diagnosis not present

## 2023-09-04 DIAGNOSIS — Z23 Encounter for immunization: Secondary | ICD-10-CM | POA: Diagnosis not present

## 2023-10-17 ENCOUNTER — Ambulatory Visit (INDEPENDENT_AMBULATORY_CARE_PROVIDER_SITE_OTHER): Payer: Medicare HMO | Admitting: Podiatry

## 2023-10-17 ENCOUNTER — Encounter: Payer: Self-pay | Admitting: Podiatry

## 2023-10-17 VITALS — Ht 76.0 in | Wt 302.0 lb

## 2023-10-17 DIAGNOSIS — B351 Tinea unguium: Secondary | ICD-10-CM

## 2023-10-17 DIAGNOSIS — M79675 Pain in left toe(s): Secondary | ICD-10-CM

## 2023-10-17 DIAGNOSIS — M79674 Pain in right toe(s): Secondary | ICD-10-CM | POA: Diagnosis not present

## 2023-10-17 DIAGNOSIS — E1142 Type 2 diabetes mellitus with diabetic polyneuropathy: Secondary | ICD-10-CM

## 2023-10-17 NOTE — Progress Notes (Signed)
This patient returns to my office for at risk foot care.  This patient requires this care by a professional since this patient will be at risk due to having diabetes. This patient is unable to cut nails himself since the patient cannot reach his nails.These nails are painful walking and wearing shoes. He also says he has developed this sore on his left big toe and is unaware how it developed.  This patient presents for at risk foot care today.  General Appearance  Alert, conversant and in no acute stress.  Vascular  Dorsalis pedis and posterior tibial  pulses are weakly palpable  bilaterally.  Capillary return is within normal limits  bilaterally. Temperature is within normal limits  bilaterally.  Neurologic  Senn-Weinstein monofilament wire test within diminished/absent  bilaterally. Muscle power within normal limits bilaterally.  Nails Thick disfigured discolored nails with subungual debris  from hallux to fifth toes bilaterally.   Orthopedic  No limitations of motion  feet .  No crepitus or effusions noted.  No bony pathology or digital deformities noted.  Hallux limitus 1st MPJ  B/L.  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 MCJ  B/L. Hammer toes  B/L.  IPJ fused secondary to infection.  Skin  normotropic skin with no porokeratosis noted bilaterally.  No signs of infections or ulcers noted.   Callus left hallux distally.  Onychomycosis  Pain in right toes  Pain in left toes  Callus left hallux secondary IPJ fusion hemorrhagic.  Consent was obtained for treatment procedures.   Mechanical debridement of nails 1-5  bilaterally performed with a nail nipper.  Filed with dremel without incident.   No drainage noted.   Return office visit    3 months                  Told patient to return for periodic foot care and evaluation due to potential at risk complications.Padding dispensed left hallux.   Helane Gunther DPM

## 2023-11-10 DIAGNOSIS — D649 Anemia, unspecified: Secondary | ICD-10-CM | POA: Diagnosis not present

## 2023-12-31 DIAGNOSIS — Z794 Long term (current) use of insulin: Secondary | ICD-10-CM | POA: Diagnosis not present

## 2023-12-31 DIAGNOSIS — I10 Essential (primary) hypertension: Secondary | ICD-10-CM | POA: Diagnosis not present

## 2023-12-31 DIAGNOSIS — E1142 Type 2 diabetes mellitus with diabetic polyneuropathy: Secondary | ICD-10-CM | POA: Diagnosis not present

## 2023-12-31 DIAGNOSIS — T148XXA Other injury of unspecified body region, initial encounter: Secondary | ICD-10-CM | POA: Diagnosis not present

## 2023-12-31 DIAGNOSIS — N1831 Chronic kidney disease, stage 3a: Secondary | ICD-10-CM | POA: Diagnosis not present

## 2024-01-16 ENCOUNTER — Encounter: Payer: Self-pay | Admitting: Podiatry

## 2024-01-16 ENCOUNTER — Ambulatory Visit (INDEPENDENT_AMBULATORY_CARE_PROVIDER_SITE_OTHER): Payer: Medicare HMO | Admitting: Podiatry

## 2024-01-16 DIAGNOSIS — B351 Tinea unguium: Secondary | ICD-10-CM | POA: Diagnosis not present

## 2024-01-16 DIAGNOSIS — M79674 Pain in right toe(s): Secondary | ICD-10-CM | POA: Diagnosis not present

## 2024-01-16 DIAGNOSIS — M79675 Pain in left toe(s): Secondary | ICD-10-CM | POA: Diagnosis not present

## 2024-01-16 DIAGNOSIS — E1142 Type 2 diabetes mellitus with diabetic polyneuropathy: Secondary | ICD-10-CM

## 2024-01-16 NOTE — Progress Notes (Signed)
 This patient returns to my office for at risk foot care.  This patient requires this care by a professional since this patient will be at risk due to having diabetes. This patient is unable to cut nails himself since the patient cannot reach his nails.These nails are painful walking and wearing shoes. He also says he has developed this sore on his left big toe and is unaware how it developed.  This patient presents for at risk foot care today.  General Appearance  Alert, conversant and in no acute stress.  Vascular  Dorsalis pedis and posterior tibial  pulses are weakly palpable  bilaterally.  Capillary return is within normal limits  bilaterally. Temperature is within normal limits  bilaterally.  Neurologic  Senn-Weinstein monofilament wire test within diminished/absent  bilaterally. Muscle power within normal limits bilaterally.  Nails Thick disfigured discolored nails with subungual debris  from hallux to fifth toes bilaterally.   Orthopedic  No limitations of motion  feet .  No crepitus or effusions noted.  No bony pathology or digital deformities noted.  Hallux limitus 1st MPJ  B/L.  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 MCJ  B/L. Hammer toes  B/L.  IPJ fused secondary to infection.  Skin  normotropic skin with no porokeratosis noted bilaterally.  No signs of infections or ulcers noted.   Callus left hallux distally.  Onychomycosis  Pain in right toes  Pain in left toes  Callus left hallux secondary IPJ fusion hemorrhagic.  Consent was obtained for treatment procedures.   Mechanical debridement of nails 1-5  bilaterally performed with a nail nipper.  Filed with dremel without incident.   No drainage noted.   Return office visit    3 months                  Told patient to return for periodic foot care and evaluation due to potential at risk complications.Padding dispensed left hallux.   Helane Gunther DPM

## 2024-04-19 ENCOUNTER — Ambulatory Visit: Admitting: Podiatry

## 2024-04-19 ENCOUNTER — Encounter: Payer: Self-pay | Admitting: Podiatry

## 2024-04-19 DIAGNOSIS — E1142 Type 2 diabetes mellitus with diabetic polyneuropathy: Secondary | ICD-10-CM

## 2024-04-19 DIAGNOSIS — B351 Tinea unguium: Secondary | ICD-10-CM

## 2024-04-19 DIAGNOSIS — M79674 Pain in right toe(s): Secondary | ICD-10-CM

## 2024-04-19 DIAGNOSIS — M79675 Pain in left toe(s): Secondary | ICD-10-CM

## 2024-04-19 NOTE — Progress Notes (Signed)
 This patient returns to my office for at risk foot care.  This patient requires this care by a professional since this patient will be at risk due to having diabetes. This patient is unable to cut nails himself since the patient cannot reach his nails.These nails are painful walking and wearing shoes. He also says he has developed this sore on his left big toe and is unaware how it developed.  This patient presents for at risk foot care today.  General Appearance  Alert, conversant and in no acute stress.  Vascular  Dorsalis pedis and posterior tibial  pulses are weakly palpable  bilaterally.  Capillary return is within normal limits  bilaterally. Temperature is within normal limits  bilaterally.  Neurologic  Senn-Weinstein monofilament wire test within diminished/absent  bilaterally. Muscle power within normal limits bilaterally.  Nails Thick disfigured discolored nails with subungual debris  from hallux to fifth toes bilaterally.   Orthopedic  No limitations of motion  feet .  No crepitus or effusions noted.  No bony pathology or digital deformities noted.  Hallux limitus 1st MPJ  B/L.  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 MCJ  B/L. Hammer toes  B/L.  IPJ fused secondary to infection.  Skin  normotropic skin with no porokeratosis noted bilaterally.  No signs of infections or ulcers noted.   Callus left hallux distally. Skin lesion dorsum left hallux and second toe left foot.  Onychomycosis  Pain in right toes  Pain in left toes  Callus left hallux secondary   Consent was obtained for treatment procedures.   Mechanical debridement of nails 1-5  bilaterally performed with a nail nipper.  Filed with dremel without incident.   No drainage noted.   Return office visit    3 months                  Told patient to return for periodic foot care and evaluation due to potential at risk complications. Padding dispensed left hallux.   Cordella Bold DPM

## 2024-04-27 DIAGNOSIS — I10 Essential (primary) hypertension: Secondary | ICD-10-CM | POA: Diagnosis not present

## 2024-04-27 DIAGNOSIS — E1142 Type 2 diabetes mellitus with diabetic polyneuropathy: Secondary | ICD-10-CM | POA: Diagnosis not present

## 2024-04-27 DIAGNOSIS — N1831 Chronic kidney disease, stage 3a: Secondary | ICD-10-CM | POA: Diagnosis not present

## 2024-04-27 DIAGNOSIS — Z794 Long term (current) use of insulin: Secondary | ICD-10-CM | POA: Diagnosis not present

## 2024-06-24 DIAGNOSIS — N400 Enlarged prostate without lower urinary tract symptoms: Secondary | ICD-10-CM | POA: Diagnosis not present

## 2024-06-24 DIAGNOSIS — E785 Hyperlipidemia, unspecified: Secondary | ICD-10-CM | POA: Diagnosis not present

## 2024-06-24 DIAGNOSIS — E1122 Type 2 diabetes mellitus with diabetic chronic kidney disease: Secondary | ICD-10-CM | POA: Diagnosis not present

## 2024-06-24 DIAGNOSIS — C61 Malignant neoplasm of prostate: Secondary | ICD-10-CM | POA: Diagnosis not present

## 2024-06-24 DIAGNOSIS — G4733 Obstructive sleep apnea (adult) (pediatric): Secondary | ICD-10-CM | POA: Diagnosis not present

## 2024-06-24 DIAGNOSIS — E11311 Type 2 diabetes mellitus with unspecified diabetic retinopathy with macular edema: Secondary | ICD-10-CM | POA: Diagnosis not present

## 2024-06-24 DIAGNOSIS — E1142 Type 2 diabetes mellitus with diabetic polyneuropathy: Secondary | ICD-10-CM | POA: Diagnosis not present

## 2024-06-24 DIAGNOSIS — I129 Hypertensive chronic kidney disease with stage 1 through stage 4 chronic kidney disease, or unspecified chronic kidney disease: Secondary | ICD-10-CM | POA: Diagnosis not present

## 2024-06-24 DIAGNOSIS — N189 Chronic kidney disease, unspecified: Secondary | ICD-10-CM | POA: Diagnosis not present

## 2024-07-21 ENCOUNTER — Encounter: Payer: Self-pay | Admitting: Podiatry

## 2024-07-21 ENCOUNTER — Ambulatory Visit: Admitting: Podiatry

## 2024-07-21 DIAGNOSIS — M79675 Pain in left toe(s): Secondary | ICD-10-CM | POA: Diagnosis not present

## 2024-07-21 DIAGNOSIS — B351 Tinea unguium: Secondary | ICD-10-CM

## 2024-07-21 DIAGNOSIS — L84 Corns and callosities: Secondary | ICD-10-CM | POA: Diagnosis not present

## 2024-07-21 DIAGNOSIS — E1142 Type 2 diabetes mellitus with diabetic polyneuropathy: Secondary | ICD-10-CM

## 2024-07-21 DIAGNOSIS — M79674 Pain in right toe(s): Secondary | ICD-10-CM | POA: Diagnosis not present

## 2024-07-21 NOTE — Progress Notes (Signed)
 This patient returns to my office for at risk foot care.  This patient requires this care by a professional since this patient will be at risk due to having diabetes. This patient is unable to cut nails himself since the patient cannot reach his nails.These nails are painful walking and wearing shoes. He also says he has developed this sore on his left big toe and is unaware how it developed.  This patient presents for at risk foot care today.  General Appearance  Alert, conversant and in no acute stress.  Vascular  Dorsalis pedis and posterior tibial  pulses are weakly palpable  bilaterally.  Capillary return is within normal limits  bilaterally. Temperature is within normal limits  bilaterally.  Neurologic  Senn-Weinstein monofilament wire test within diminished/absent  bilaterally. Muscle power within normal limits bilaterally.  Nails Thick disfigured discolored nails with subungual debris  from hallux to fifth toes bilaterally.   Orthopedic  No limitations of motion  feet .  No crepitus or effusions noted.  No bony pathology or digital deformities noted.  Hallux limitus 1st MPJ  B/L.  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 MCJ  B/L. Hammer toes  B/L.  IPJ fused secondary to infection.  Skin  normotropic skin with no porokeratosis noted bilaterally.  No signs of infections or ulcers noted.   Callus left hallux distally.   Onychomycosis  Pain in right toes  Pain in left toes  Callus left hallux secondary   Consent was obtained for treatment procedures.   Mechanical debridement of nails 1-5  bilaterally performed with a nail nipper.  Filed with dremel without incident.   No drainage noted.   Return office visit    3 months                  Told patient to return for periodic foot care and evaluation due to potential at risk complications.    Cordella Bold DPM

## 2024-08-26 DIAGNOSIS — E1142 Type 2 diabetes mellitus with diabetic polyneuropathy: Secondary | ICD-10-CM | POA: Diagnosis not present

## 2024-08-26 DIAGNOSIS — Z79899 Other long term (current) drug therapy: Secondary | ICD-10-CM | POA: Diagnosis not present

## 2024-10-21 ENCOUNTER — Ambulatory Visit: Admitting: Podiatry
